# Patient Record
Sex: Male | Born: 1965 | Race: White | Hispanic: No | Marital: Married | Smoking: Never smoker
Health system: Southern US, Community
[De-identification: ages and names within clinical notes are randomized; demographics above are authoritative.]

## PROBLEM LIST (undated history)

## (undated) DIAGNOSIS — T148XXA Other injury of unspecified body region, initial encounter: Secondary | ICD-10-CM

## (undated) DIAGNOSIS — I10 Essential (primary) hypertension: Secondary | ICD-10-CM

## (undated) HISTORY — DX: Other injury of unspecified body region, initial encounter: T14.8XXA

## (undated) HISTORY — PX: ANAL FISSURE REPAIR: SHX2312

---

## 2008-08-11 ENCOUNTER — Ambulatory Visit: Payer: Self-pay | Admitting: Cardiovascular Disease

## 2008-08-11 ENCOUNTER — Ambulatory Visit: Payer: Self-pay | Admitting: Surgery

## 2008-08-15 ENCOUNTER — Ambulatory Visit: Payer: Self-pay | Admitting: Surgery

## 2009-03-04 ENCOUNTER — Ambulatory Visit: Payer: Self-pay | Admitting: Family Medicine

## 2011-04-07 ENCOUNTER — Ambulatory Visit: Payer: Self-pay | Admitting: Family Medicine

## 2011-04-08 ENCOUNTER — Ambulatory Visit: Payer: Self-pay | Admitting: Family Medicine

## 2012-02-15 LAB — HM COLONOSCOPY

## 2012-05-14 ENCOUNTER — Ambulatory Visit: Payer: Self-pay | Admitting: Family Medicine

## 2013-09-13 LAB — HEPATIC FUNCTION PANEL
ALK PHOS: 54 U/L (ref 25–125)
ALT: 92 U/L — AB (ref 10–40)
AST: 53 U/L — AB (ref 14–40)
Bilirubin, Total: 0.4 mg/dL

## 2013-09-13 LAB — LIPID PANEL
Cholesterol: 219 mg/dL — AB (ref 0–200)
HDL: 94 mg/dL — AB (ref 35–70)
LDL Cholesterol: 89 mg/dL
LDL/HDL RATIO: 0.9
Triglycerides: 181 mg/dL — AB (ref 40–160)

## 2013-09-13 LAB — BASIC METABOLIC PANEL
BUN: 12 mg/dL (ref 4–21)
Creatinine: 1.2 mg/dL (ref <=1.3)
GLUCOSE: 81 mg/dL
Potassium: 4.7 mmol/L (ref 3.4–5.3)
SODIUM: 144 mmol/L (ref 137–147)

## 2013-09-13 LAB — CBC AND DIFFERENTIAL
HEMATOCRIT: 48 % (ref 41–53)
Hemoglobin: 16.5 g/dL (ref 13.5–17.5)
Neutrophils Absolute: 55 /uL
Platelets: 227 10*3/uL (ref 150–399)
WBC: 4.4 10^3/mL

## 2013-09-13 LAB — PSA: PSA: 0.7

## 2013-09-13 LAB — TSH: TSH: 2.2 u[IU]/mL (ref <=5.90)

## 2014-09-05 DIAGNOSIS — E782 Mixed hyperlipidemia: Secondary | ICD-10-CM | POA: Insufficient documentation

## 2014-09-05 DIAGNOSIS — I493 Ventricular premature depolarization: Secondary | ICD-10-CM | POA: Insufficient documentation

## 2015-01-02 DIAGNOSIS — I872 Venous insufficiency (chronic) (peripheral): Secondary | ICD-10-CM | POA: Insufficient documentation

## 2015-01-02 DIAGNOSIS — I8390 Asymptomatic varicose veins of unspecified lower extremity: Secondary | ICD-10-CM | POA: Insufficient documentation

## 2015-01-02 DIAGNOSIS — K760 Fatty (change of) liver, not elsewhere classified: Secondary | ICD-10-CM | POA: Insufficient documentation

## 2015-01-02 DIAGNOSIS — E785 Hyperlipidemia, unspecified: Secondary | ICD-10-CM | POA: Insufficient documentation

## 2015-01-02 DIAGNOSIS — E669 Obesity, unspecified: Secondary | ICD-10-CM | POA: Insufficient documentation

## 2015-01-02 DIAGNOSIS — I10 Essential (primary) hypertension: Secondary | ICD-10-CM | POA: Insufficient documentation

## 2015-03-09 ENCOUNTER — Ambulatory Visit (INDEPENDENT_AMBULATORY_CARE_PROVIDER_SITE_OTHER): Payer: BC Managed Care – PPO | Admitting: Family Medicine

## 2015-03-09 ENCOUNTER — Encounter: Payer: Self-pay | Admitting: Family Medicine

## 2015-03-09 VITALS — BP 110/62 | HR 72 | Temp 97.9°F | Resp 16 | Wt 219.0 lb

## 2015-03-09 DIAGNOSIS — K219 Gastro-esophageal reflux disease without esophagitis: Secondary | ICD-10-CM | POA: Diagnosis not present

## 2015-03-09 DIAGNOSIS — R140 Abdominal distension (gaseous): Secondary | ICD-10-CM

## 2015-03-09 MED ORDER — OMEPRAZOLE 20 MG PO CPDR: 20.0000 mg | DELAYED_RELEASE_CAPSULE | Freq: Two times a day (BID) | ORAL | Status: DC

## 2015-03-09 NOTE — Progress Notes (Signed)
Patient ID: Mason Henderson, male   DOB: 1966-04-04, 49 y.o.   MRN: [id number]    Subjective:  HPI Pt is here today because he has been having some abdominal bloating for about a week and a half. He reports that it is mainly after he eats. He reports that since he made this appointment he has not had any, but think it could be a hernia. Denies increased belching, heartburn, nausea, vomiting, diarrhea or constipation. He reports that the bloating is like a pressure sensation in the epigastric area. Starts about 30 mins after he eats and last a couple hours. He reports that sometimes it does hurt through to the left side of is back.   Prior to Admission medications   Medication Sig Start Date End Date Taking? Authorizing Provider  amLODipine-olmesartan (AZOR) 10-40 MG per tablet Take by mouth. 05/20/14  Yes Historical Provider, MD  hydrochlorothiazide (HYDRODIURIL) 25 MG tablet Take by mouth. 08/18/14  Yes Historical Provider, MD  pravastatin (PRAVACHOL) 20 MG tablet Take by mouth. 07/16/14  Yes Historical Provider, MD    Patient Active Problem List   Diagnosis Date Noted  . Chronic venous insufficiency 01/02/2015  . Essential (primary) hypertension 01/02/2015  . Fatty infiltration of liver 01/02/2015  . HLD (hyperlipidemia) 01/02/2015  . Adiposity 01/02/2015  . 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 Beat, premature ventricular 09/05/2014    History reviewed. No pertinent past medical history.  History   Social History  . Marital Status: Single    Spouse Name: N/A  . Number of Children: N/A  . Years of Education: N/A   Occupational History  . Not on file.   Social History Main Topics  . Smoking status: Never Smoker   . Smokeless tobacco: Not on file  . Alcohol Use: Yes     Comment: one drink daily  . Drug Use: No  . Sexual Activity: Not on file   Other Topics Concern  . Not on file   Social History Narrative    No  Known Allergies  Review of Systems  Constitutional: Negative.   HENT: Negative.   Eyes: Negative.   Respiratory: Negative.   Cardiovascular: Negative.   Gastrointestinal: Negative.   Genitourinary: Negative.   Musculoskeletal: Negative.   Skin: Negative.   Neurological: Negative.   Endo/Heme/Allergies: Negative.   Psychiatric/Behavioral: Negative.     Immunization History  Administered Date(s) Administered  . Tdap 11/19/2008   Objective:  BP 110/62 mmHg  Pulse 72  Temp(Src) 97.9 F (36.6 C) (Oral)  Resp 16  Wt 219 lb (99.338 kg)  Physical Exam  Constitutional: He is oriented to person, place, and time and well-developed, well-nourished, and in no distress.  HENT:  Head: Normocephalic and atraumatic.  Right Ear: External ear normal.  Left Ear: External ear normal.  Nose: Nose normal.  Eyes: Conjunctivae and EOM are normal. Pupils are equal, round, and reactive to light.  Neck: Normal range of motion. Neck supple.  Cardiovascular: Normal rate, regular rhythm, normal heart sounds and intact distal pulses.   Pulmonary/Chest: Effort normal and breath sounds normal.  Abdominal: Soft. Bowel sounds are normal.  Negative Murphy's sign  Musculoskeletal: Normal range of motion.  Neurological: He is alert and oriented to person, place, and time. He has normal reflexes. GCS score is 15.  Skin: Skin is warm and dry.  Psychiatric: Mood, memory, affect and judgment normal.    Lab Results  Component Value Date   WBC  4.4 09/13/2013   HGB 16.5 09/13/2013   HCT 48 09/13/2013   PLT 227 09/13/2013   CHOL 219* 09/13/2013   TRIG 181* 09/13/2013   HDL 94* 09/13/2013   LDLCALC 89 09/13/2013   TSH 2.20 09/13/2013   PSA 0.7 09/13/2013    CMP     Component Value Date/Time   NA 144 09/13/2013   K 4.7 09/13/2013   BUN 12 09/13/2013   CREATININE 1.2 09/13/2013   AST 53* 09/13/2013   ALT 92* 09/13/2013   ALKPHOS 54 09/13/2013    Assessment and Plan :  1. Abdominal  bloating If not improved with the Omeprazole, will get gallbladder u/s. Pt will follow in a couple of weeks when he has his CPE. - omeprazole (PRILOSEC) 20 MG capsule; Take 1 capsule (20 mg total) by mouth 2 (two) times daily before a meal.  Dispense: 60 capsule; Refill: 12  2. Gastroesophageal reflux disease, esophagitis presence not specified  - omeprazole (PRILOSEC) 20 MG capsule; Take 1 capsule (20 mg total) by mouth 2 (two) times daily before a meal.  Dispense: 60 capsule; Refill: 12  I have done the exam and reviewed the above chart and it is accurate to the best of my knowledge.  Miguel Aschoff MD [facility] 03/09/2015 8:27 AM

## 2015-03-11 ENCOUNTER — Telehealth: Payer: Self-pay | Admitting: Family Medicine

## 2015-03-11 ENCOUNTER — Other Ambulatory Visit: Payer: Self-pay | Admitting: Family Medicine

## 2015-03-11 DIAGNOSIS — R1084 Generalized abdominal pain: Secondary | ICD-10-CM

## 2015-03-11 NOTE — Telephone Encounter (Signed)
His acid reflux is not really any better.  Please advise.  Thanks Con Memos

## 2015-03-11 NOTE — Telephone Encounter (Signed)
Patient called again saying that his symptoms are not any better. He says that it actually feels worse. Patient reports that he has severe pain after eating and the upper stomach pain radiates to his back. Patient would like to go ahead and proceed with gallbladder US since medication is not helping. Patient was seen in the office a few days ago by Dr. Rosanna Randy and was prescribed OTC omeprazole for symptoms. He was told that if meds do not help, then the next step would be to do a gallbladder US. Ok to proceed? Dr. Rosanna Randy is out of the office. Thanks!

## 2015-03-11 NOTE — Telephone Encounter (Signed)
Please schedule abdominal ultrasound. Thanks.

## 2015-03-17 ENCOUNTER — Ambulatory Visit
Admission: RE | Admit: 2015-03-17 | Discharge: 2015-03-17 | Disposition: A | Discharge status: Home / Self Care | Payer: BC Managed Care – PPO | Source: Ambulatory Visit | Attending: Family Medicine | Admitting: Family Medicine

## 2015-03-17 DIAGNOSIS — R1084 Generalized abdominal pain: Secondary | ICD-10-CM | POA: Diagnosis present

## 2015-03-17 DIAGNOSIS — K760 Fatty (change of) liver, not elsewhere classified: Secondary | ICD-10-CM | POA: Diagnosis not present

## 2015-03-25 ENCOUNTER — Encounter: Payer: Self-pay | Admitting: Family Medicine

## 2015-03-25 ENCOUNTER — Ambulatory Visit (INDEPENDENT_AMBULATORY_CARE_PROVIDER_SITE_OTHER): Payer: BC Managed Care – PPO | Admitting: Family Medicine

## 2015-03-25 VITALS — BP 110/62 | HR 66 | Temp 97.7°F | Resp 16 | Ht 70.0 in | Wt 222.0 lb

## 2015-03-25 DIAGNOSIS — Z1211 Encounter for screening for malignant neoplasm of colon: Secondary | ICD-10-CM | POA: Diagnosis not present

## 2015-03-25 DIAGNOSIS — Z125 Encounter for screening for malignant neoplasm of prostate: Secondary | ICD-10-CM | POA: Diagnosis not present

## 2015-03-25 DIAGNOSIS — Z0000 Encounter for general adult medical examination without abnormal findings: Secondary | ICD-10-CM

## 2015-03-25 LAB — HEMOCCULT GUIAC POC 1CARD (OFFICE): FECAL OCCULT BLD: NEGATIVE

## 2015-03-25 LAB — POCT URINALYSIS DIPSTICK
Bilirubin, UA: NEGATIVE
Blood, UA: NEGATIVE
GLUCOSE UA: NEGATIVE
Ketones, UA: NEGATIVE
LEUKOCYTES UA: NEGATIVE
Nitrite, UA: NEGATIVE
PROTEIN UA: NEGATIVE
Spec Grav, UA: 1.015
Urobilinogen, UA: 0.2
pH, UA: 6

## 2015-03-25 NOTE — Progress Notes (Signed)
Patient ID: Mason Henderson, male   DOB: 1965-09-27, 49 y.o.   MRN: [id number]       Patient: Mason Henderson, Male    DOB: 1965-09-09, 47 y.o.   MRN: [id number] Visit Date: 03/25/2015  Today's Provider: Wilhemena Durie, MD   Chief Complaint  Patient presents with  . Annual Exam   Subjective:    Annual physical exam Mason Henderson is a 49 y.o. male who presents today for health maintenance and complete physical. He feels well. He reports exercising about 3 times a week. He reports he is sleeping well.  ----------------------------------------------------------------- Colonoscopy 02/15/12 EKG- 03/03/11 Tdap 06/03/09 PSA 09/13/13   Review of Systems  Constitutional: Negative.   HENT: Negative.   Eyes: Negative.   Respiratory: Negative.   Cardiovascular: Negative.   Gastrointestinal: Positive for abdominal distention.       Abdominal symptoms are improving.  Endocrine: Negative.   Genitourinary: Negative.   Musculoskeletal: Negative.   Skin: Negative.   Allergic/Immunologic: Negative.   Neurological: Negative.   Hematological: Negative.   Psychiatric/Behavioral: Negative.   All other systems reviewed and are negative.   Social History He  reports that he has never smoked. He does not have any smokeless tobacco history on file. He reports that he drinks alcohol. He reports that he does not use illicit drugs.  Patient Active Problem List   Diagnosis Date Noted  . Chronic venous insufficiency 01/02/2015  . Essential (primary) hypertension 01/02/2015  . Fatty infiltration of liver 01/02/2015  . HLD (hyperlipidemia) 01/02/2015  . Adiposity 01/02/2015  . 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 Beat, premature ventricular 09/05/2014    Past Surgical History  Procedure Laterality Date  . Anal fissure repair      Family History  Family Status  Relation Status Death Age  . Mother Deceased 20  . Father Alive   .  Sister Alive   . Son Alive     His family history includes Breast cancer in his mother; CAD in his paternal grandfather; Colon polyps in his father; Diverticulitis in his father; Esophageal cancer in his mother; Heart attack in his paternal grandfather; Hypertension in his father and sister; Lung cancer in his father.    No Known Allergies  Previous Medications   AMLODIPINE-OLMESARTAN (AZOR) 10-40 MG PER TABLET    Take by mouth.   HYDROCHLOROTHIAZIDE (HYDRODIURIL) 25 MG TABLET    Take by mouth.   OMEPRAZOLE (PRILOSEC) 20 MG CAPSULE    Take 1 capsule (20 mg total) by mouth 2 (two) times daily before a meal.   PRAVASTATIN (PRAVACHOL) 20 MG TABLET    Take by mouth.    Patient Care Team: Jerrol Banana., MD as PCP - General (Family Medicine)     Objective:   Vitals: BP 110/62 mmHg  Pulse 66  Temp(Src) 97.7 F (36.5 C) (Oral)  Resp 16  Ht 5\' 10"  (1.778 m)  Wt 222 lb (100.699 kg)  BMI 31.85 kg/m2   Physical Exam  Constitutional: He is oriented to person, place, and time. He appears well-developed and well-nourished.  HENT:  Head: Normocephalic and atraumatic.  Right Ear: External ear normal.  Left Ear: External ear normal.  Nose: Nose normal.  Mouth/Throat: Oropharynx is clear and moist.  Eyes: Conjunctivae and EOM are normal. Pupils are equal, round, and reactive to light.  Neck: Normal range of motion. Neck supple.  Cardiovascular: Normal rate, regular rhythm, normal heart sounds and  intact distal pulses.   Pulmonary/Chest: Effort normal and breath sounds normal.  Abdominal: Soft. Bowel sounds are normal.  Genitourinary: Rectum normal, prostate normal and penis normal.  Musculoskeletal: Normal range of motion.  Neurological: He is alert and oriented to person, place, and time. He has normal reflexes.  Skin: Skin is warm and dry.  Psychiatric: He has a normal mood and affect. His behavior is normal. Judgment and thought content normal.     Depression Screen No  flowsheet data found.    Assessment & Plan:     Routine Health Maintenance and Physical Exam  Exercise Activities and Dietary recommendations Goals    None      Immunization History  Administered Date(s) Administered  . Tdap 11/19/2008    Health Maintenance  Topic Date Due  . HIV Screening  10/29/1980  . INFLUENZA VACCINE  03/09/2015  . TETANUS/TDAP  11/20/2018      Discussed health benefits of physical activity, and encouraged him to engage in regular exercise appropriate for his age and condition.   Hypertension Azor it is disallowed by insurance although he has been well controlled on this for at least 4-6 years. Switch to amlodipine and losartan 100. I think this will work. Return to clinic 1-2 months. 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 I have done the exam and reviewed the above chart and it is accurate to the best of my knowledge.  --------------------------------------------------------------------

## 2015-04-08 ENCOUNTER — Other Ambulatory Visit: Payer: Self-pay | Admitting: Family Medicine

## 2015-04-09 LAB — CBC WITH DIFFERENTIAL/PLATELET
BASOS ABS: 0.1 10*3/uL (ref 0.0–0.2)
Basos: 1 %
EOS (ABSOLUTE): 0.1 10*3/uL (ref 0.0–0.4)
Eos: 3 %
Hematocrit: 43.4 % (ref 37.5–51.0)
Hemoglobin: 15.3 g/dL (ref 12.6–17.7)
IMMATURE GRANS (ABS): 0 10*3/uL (ref 0.0–0.1)
Immature Granulocytes: 0 %
LYMPHS: 27 %
Lymphocytes Absolute: 1.3 10*3/uL (ref 0.7–3.1)
MCH: 32.1 pg (ref 26.6–33.0)
MCHC: 35.3 g/dL (ref 31.5–35.7)
MCV: 91 fL (ref 79–97)
MONOS ABS: 0.5 10*3/uL (ref 0.1–0.9)
Monocytes: 9 %
NEUTROS ABS: 2.9 10*3/uL (ref 1.4–7.0)
Neutrophils: 60 %
PLATELETS: 256 10*3/uL (ref 150–379)
RBC: 4.77 x10E6/uL (ref 4.14–5.80)
RDW: 12.9 % (ref 12.3–15.4)
WBC: 4.8 10*3/uL (ref 3.4–10.8)

## 2015-04-09 LAB — COMPREHENSIVE METABOLIC PANEL
A/G RATIO: 2.3 (ref 1.1–2.5)
ALK PHOS: 43 IU/L (ref 39–117)
ALT: 42 IU/L (ref 0–44)
AST: 32 IU/L (ref 0–40)
Albumin: 4.8 g/dL (ref 3.5–5.5)
BILIRUBIN TOTAL: 0.8 mg/dL (ref 0.0–1.2)
BUN/Creatinine Ratio: 19 (ref 9–20)
BUN: 22 mg/dL (ref 6–24)
CHLORIDE: 101 mmol/L (ref 97–108)
CO2: 24 mmol/L (ref 18–29)
Calcium: 10.5 mg/dL — ABNORMAL HIGH (ref 8.7–10.2)
Creatinine, Ser: 1.13 mg/dL (ref 0.76–1.27)
GFR calc Af Amer: 88 mL/min/{1.73_m2} (ref >59)
GFR calc non Af Amer: 76 mL/min/{1.73_m2} (ref >59)
GLUCOSE: 94 mg/dL (ref 65–99)
Globulin, Total: 2.1 g/dL (ref 1.5–4.5)
POTASSIUM: 4.6 mmol/L (ref 3.5–5.2)
Sodium: 139 mmol/L (ref 134–144)
TOTAL PROTEIN: 6.9 g/dL (ref 6.0–8.5)

## 2015-04-09 LAB — LIPID PANEL WITH LDL/HDL RATIO
CHOLESTEROL TOTAL: 171 mg/dL (ref 100–199)
HDL: 63 mg/dL (ref >39)
LDL Calculated: 83 mg/dL (ref 0–99)
LDl/HDL Ratio: 1.3 ratio units (ref 0.0–3.6)
TRIGLYCERIDES: 127 mg/dL (ref 0–149)
VLDL CHOLESTEROL CAL: 25 mg/dL (ref 5–40)

## 2015-04-09 LAB — PSA: Prostate Specific Ag, Serum: 0.6 ng/mL (ref 0.0–4.0)

## 2015-04-09 LAB — TSH: TSH: 1.57 u[IU]/mL (ref 0.450–4.500)

## 2015-05-20 ENCOUNTER — Other Ambulatory Visit: Payer: Self-pay | Admitting: Family Medicine

## 2015-06-04 ENCOUNTER — Other Ambulatory Visit: Payer: Self-pay | Admitting: Family Medicine

## 2015-08-11 ENCOUNTER — Other Ambulatory Visit: Payer: Self-pay | Admitting: Family Medicine

## 2015-08-20 ENCOUNTER — Other Ambulatory Visit: Payer: Self-pay | Admitting: Family Medicine

## 2015-09-23 ENCOUNTER — Ambulatory Visit: Payer: BC Managed Care – PPO | Admitting: Family Medicine

## 2015-09-30 ENCOUNTER — Ambulatory Visit (INDEPENDENT_AMBULATORY_CARE_PROVIDER_SITE_OTHER): Payer: BC Managed Care – PPO | Admitting: Family Medicine

## 2015-09-30 ENCOUNTER — Encounter: Payer: Self-pay | Admitting: Family Medicine

## 2015-09-30 VITALS — BP 138/78 | HR 82 | Temp 98.6°F | Resp 16 | Wt 222.0 lb

## 2015-09-30 DIAGNOSIS — E785 Hyperlipidemia, unspecified: Secondary | ICD-10-CM | POA: Diagnosis not present

## 2015-09-30 DIAGNOSIS — I10 Essential (primary) hypertension: Secondary | ICD-10-CM

## 2015-09-30 NOTE — Progress Notes (Signed)
Patient ID: Mason Henderson, male   DOB: 12-01-1965, 50 y.o.   MRN: [id number]    Subjective:  HPI  Hypertension, follow-up:  BP Readings from Last 3 Encounters:  09/30/15 138/78  03/25/15 110/62  03/09/15 110/62    He was last seen for hypertension 6 months ago.  BP at that visit was 110/62. Management since that visit includes none. He reports good compliance with treatment. He is not having side effects.  He is exercising. He is adherent to low salt diet.   Outside blood pressures are 120's/70-80's. He is experiencing none.  Patient denies chest pain, chest pressure/discomfort, claudication, exertional chest pressure/discomfort, fatigue, irregular heart beat, lower extremity edema, orthopnea and palpitations.   Wt Readings from Last 3 Encounters:  09/30/15 222 lb (100.699 kg)  03/25/15 222 lb (100.699 kg)  03/09/15 219 lb (99.338 kg)   ------------------------------------------------------------------------  On last labs elevated calcium was noted. Need to repeat calcium and PTH levels today. Pt reports that he has a water system on his house that he did not before and he calcium was never high until he moved in this house. He thinks this may be where this is coming from.    Prior to Admission medications   Medication Sig Start Date End Date Taking? Authorizing Provider  AZOR 10-40 MG tablet TAKE 1 TABLET BY MOUTH EVERY DAY 08/20/15  Yes Richard Maceo Pro., MD  hydrochlorothiazide (HYDRODIURIL) 25 MG tablet TAKE 1 TABLET BY MOUTH DAILY. 08/12/15  Yes Richard Maceo Pro., MD  omeprazole (PRILOSEC) 20 MG capsule Take 1 capsule (20 mg total) by mouth 2 (two) times daily before a meal. 03/09/15  Yes Jerrol Banana., MD  pravastatin (PRAVACHOL) 20 MG tablet TAKE 1 TABLET BY MOUTH EVERY DAY 06/04/15  Yes Jerrol Banana., MD    Patient Active Problem List   Diagnosis Date Noted  . Chronic venous insufficiency 01/02/2015  . Essential (primary) hypertension  01/02/2015  . Fatty infiltration of liver 01/02/2015  . HLD (hyperlipidemia) 01/02/2015  . Adiposity 01/02/2015  . 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 Beat, premature ventricular 09/05/2014    History reviewed. No pertinent past medical history.  Social History   Social History  . Marital Status: Single    Spouse Name: N/A  . Number of Children: N/A  . Years of Education: N/A   Occupational History  . Not on file.   Social History Main Topics  . Smoking status: Never Smoker   . Smokeless tobacco: Not on file  . Alcohol Use: Yes     Comment: one drink daily  . Drug Use: No  . Sexual Activity: Not on file   Other Topics Concern  . Not on file   Social History Narrative    No Known Allergies  Review of Systems  Constitutional: Negative.   HENT: Negative.   Eyes: Negative.   Respiratory: Negative.   Cardiovascular: Negative.   Gastrointestinal: Negative.   Genitourinary: Negative.   Musculoskeletal: Negative.   Skin: Negative.   Neurological: Negative.   Endo/Heme/Allergies: Negative.   Psychiatric/Behavioral: Negative.     Immunization History  Administered Date(s) Administered  . Influenza-Unspecified 06/09/2015  . Tdap 11/19/2008   Objective:  BP 138/78 mmHg  Pulse 82  Temp(Src) 98.6 F (37 C) (Oral)  Resp 16  Wt 222 lb (100.699 kg)  Physical Exam  Constitutional: He is oriented to person, place, and time and well-developed, well-nourished, and in  no distress.  Eyes: Conjunctivae and EOM are normal. Pupils are equal, round, and reactive to light.  Neck: Normal range of motion. Neck supple.  Cardiovascular: Normal rate, regular rhythm, normal heart sounds and intact distal pulses.   Pulmonary/Chest: Effort normal and breath sounds normal.  Abdominal: Soft. Bowel sounds are normal.  Musculoskeletal: Normal range of motion.  Neurological: He is alert and oriented to person, place, and time.  He has normal reflexes. Gait normal. GCS score is 15.  Skin: Skin is warm and dry.  Psychiatric: Mood, memory, affect and judgment normal.    Lab Results  Component Value Date   WBC 4.8 04/08/2015   HGB 16.5 09/13/2013   HCT 43.4 04/08/2015   PLT 256 04/08/2015   GLUCOSE 94 04/08/2015   CHOL 171 04/08/2015   TRIG 127 04/08/2015   HDL 63 04/08/2015   LDLCALC 83 04/08/2015   TSH 1.570 04/08/2015   PSA 0.7 09/13/2013    CMP     Component Value Date/Time   NA 139 04/08/2015 1011   K 4.6 04/08/2015 1011   CL 101 04/08/2015 1011   CO2 24 04/08/2015 1011   GLUCOSE 94 04/08/2015 1011   BUN 22 04/08/2015 1011   CREATININE 1.13 04/08/2015 1011   CREATININE 1.2 09/13/2013   CALCIUM 10.5* 04/08/2015 1011   PROT 6.9 04/08/2015 1011   ALBUMIN 4.8 04/08/2015 1011   AST 32 04/08/2015 1011   ALT 42 04/08/2015 1011   ALKPHOS 43 04/08/2015 1011   BILITOT 0.8 04/08/2015 1011   GFRNONAA 76 04/08/2015 1011   GFRAA 88 04/08/2015 1011    Assessment and Plan :  1. Essential (primary) hypertension Stable.   2. HLD (hyperlipidemia)   3. Hypercalcemia Could be due to water system. Will recheck and follow.  - Calcium, ionized - PTH, Intact and Calcium   Patient was seen and examined by Dr. Miguel Aschoff, and noted scribed by Webb Laws, Disney MD [facility] 09/30/2015 2:21 PM

## 2015-10-01 LAB — PTH, INTACT AND CALCIUM
CALCIUM: 10 mg/dL (ref 8.7–10.2)
PTH: 41 pg/mL (ref 15–65)

## 2015-10-01 LAB — CALCIUM, IONIZED: CALCIUM ION: 5.3 mg/dL (ref 4.5–5.6)

## 2015-11-11 ENCOUNTER — Other Ambulatory Visit: Payer: Self-pay | Admitting: Family Medicine

## 2016-04-14 ENCOUNTER — Ambulatory Visit (INDEPENDENT_AMBULATORY_CARE_PROVIDER_SITE_OTHER): Payer: BC Managed Care – PPO | Admitting: Family Medicine

## 2016-04-14 ENCOUNTER — Encounter: Payer: Self-pay | Admitting: Family Medicine

## 2016-04-14 VITALS — BP 108/60 | HR 68 | Temp 98.0°F | Resp 16 | Ht 70.0 in | Wt 222.0 lb

## 2016-04-14 DIAGNOSIS — Z1211 Encounter for screening for malignant neoplasm of colon: Secondary | ICD-10-CM | POA: Diagnosis not present

## 2016-04-14 DIAGNOSIS — Z0000 Encounter for general adult medical examination without abnormal findings: Secondary | ICD-10-CM

## 2016-04-14 DIAGNOSIS — Z125 Encounter for screening for malignant neoplasm of prostate: Secondary | ICD-10-CM | POA: Diagnosis not present

## 2016-04-14 LAB — POCT URINALYSIS DIPSTICK
BILIRUBIN UA: ABNORMAL
Blood, UA: NEGATIVE
GLUCOSE UA: NEGATIVE
Ketones, UA: NEGATIVE
Leukocytes, UA: NEGATIVE
NITRITE UA: NEGATIVE
Protein, UA: NEGATIVE
Spec Grav, UA: 1.02
UROBILINOGEN UA: NEGATIVE
pH, UA: 6

## 2016-04-14 LAB — IFOBT (OCCULT BLOOD): IMMUNOLOGICAL FECAL OCCULT BLOOD TEST: NEGATIVE

## 2016-04-14 NOTE — Progress Notes (Signed)
Patient: Mason Henderson, Male    DOB: [birth year], 50 y.o.   MRN: [id number] Visit Date: 04/14/2016  Today's Provider: Wilhemena Durie, MD   Chief Complaint  Patient presents with  . Annual Exam   Subjective:  Mason Henderson is a 50 y.o. male who presents today for health maintenance and complete physical. He feels well. He reports exercising 3 times weekly. He reports he is sleeping well. Immunization History  Administered Date(s) Administered  . Influenza-Unspecified 06/09/2015  . Tdap 11/19/2008   02/15/12 Colonoscopy-hemorrhoids, repeat 10 years, Dr. Vira Agar   Review of Systems  Constitutional: Negative.   HENT: Negative.   Eyes: Negative.   Respiratory: Negative.   Cardiovascular: Negative.   Gastrointestinal: Negative.   Endocrine: Negative.   Genitourinary: Negative.   Musculoskeletal: Negative.   Skin: Negative.   Allergic/Immunologic: Negative.   Neurological: Negative.   Hematological: Negative.   Psychiatric/Behavioral: Negative.     Social History   Social History  . Marital status: Single    Spouse name: N/A  . Number of children: N/A  . Years of education: N/A   Occupational History  . Not on file.   Social History Main Topics  . Smoking status: Never Smoker  . Smokeless tobacco: Not on file  . Alcohol use Yes     Comment: one drink daily  . Drug use: No  . Sexual activity: Not on file   Other Topics Concern  . Not on file   Social History Narrative  . No narrative on file    Patient Active Problem List   Diagnosis Date Noted  . Chronic venous insufficiency 01/02/2015  . Essential (primary) hypertension 01/02/2015  . Fatty infiltration of liver 01/02/2015  . HLD (hyperlipidemia) 01/02/2015  . Adiposity 01/02/2015  . 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 Beat, premature ventricular 09/05/2014    Past Surgical History:  Procedure Laterality Date  . ANAL FISSURE REPAIR       His family history includes Breast cancer in his mother; CAD in his paternal grandfather; Colon polyps in his father; Diverticulitis in his father; Esophageal cancer in his mother; Heart attack in his paternal grandfather; Hypertension in his father and sister; Lung cancer in his father.    Outpatient Encounter Prescriptions as of 04/14/2016  Medication Sig  . AZOR 10-40 MG tablet TAKE 1 TABLET BY MOUTH EVERY DAY  . etodolac (LODINE) 500 MG tablet Take 500 mg by mouth 2 (two) times daily as needed.  . hydrochlorothiazide (HYDRODIURIL) 25 MG tablet TAKE 1 TABLET BY MOUTH DAILY.  . pravastatin (PRAVACHOL) 20 MG tablet TAKE 1 TABLET BY MOUTH EVERY DAY  . [DISCONTINUED] omeprazole (PRILOSEC) 20 MG capsule Take 1 capsule (20 mg total) by mouth 2 (two) times daily before a meal.   No facility-administered encounter medications on file as of 04/14/2016.     Patient Care Team: Jerrol Banana., MD as PCP - General (Family Medicine)     Objective:   Vitals:  Vitals:   04/14/16 0850  BP: 108/60  Pulse: 68  Resp: 16  Temp: 98 F (36.7 C)  TempSrc: Oral  Weight: 222 lb (100.7 kg)  Height: 5\' 10"  (1.778 m)    Physical Exam  Constitutional: He is oriented to person, place, and time. He appears well-developed and well-nourished.  HENT:  Head: Normocephalic and atraumatic.  Right Ear: External ear normal.  Left Ear: External ear normal.  Nose: Nose normal.  Mouth/Throat:  Oropharynx is clear and moist.  Eyes: Conjunctivae and EOM are normal. Pupils are equal, round, and reactive to light.  Neck: Normal range of motion. Neck supple.  Cardiovascular: Normal rate, regular rhythm, normal heart sounds and intact distal pulses.   Pulmonary/Chest: Effort normal and breath sounds normal.  Abdominal: Soft. Bowel sounds are normal.  Small easily reducible umbilical hernia  Genitourinary: Rectum normal, prostate normal and penis normal.  Musculoskeletal: Normal range of motion.   Neurological: He is alert and oriented to person, place, and time.  Skin: Skin is warm and dry.  Psychiatric: He has a normal mood and affect. His behavior is normal. Judgment and thought content normal.     Depression Screen PHQ 2/9 Scores 04/14/2016  PHQ - 2 Score 0   Fall Risk  04/14/2016  Falls in the past year? Yes  Number falls in past yr: 1  Injury with Fall? Yes   Functional Status Survey: Is the patient deaf or have difficulty hearing?: No Does the patient have difficulty seeing, even when wearing glasses/contacts?: No Does the patient have difficulty concentrating, remembering, or making decisions?: No Does the patient have difficulty walking or climbing stairs?: No Does the patient have difficulty dressing or bathing?: No Does the patient have difficulty doing errands alone such as visiting a doctor's office or shopping?: No   Current Exercise Habits: Home exercise routine, Time (Minutes): 60, Frequency (Times/Week): 3, Weekly Exercise (Minutes/Week): 180 Exercise limited by: cardiac condition(s)    Assessment & Plan:     Routine Health Maintenance and Physical Exam  Exercise Activities and Dietary recommendations Goals    None      Immunization History  Administered Date(s) Administered  . Influenza-Unspecified 06/09/2015  . Tdap 11/19/2008    Health Maintenance  Topic Date Due  . HIV Screening  10/29/1980  . INFLUENZA VACCINE  03/08/2016  . TETANUS/TDAP  11/20/2018  . COLONOSCOPY  02/14/2022    1. Annual physical exam  - CBC with Differential/Platelet - Comprehensive metabolic panel - Lipid Panel With LDL/HDL Ratio - TSH - POCT urinalysis dipstick  2. Prostate cancer screening  - PSA  3. Colon cancer screening  - IFOBT POC (occult bld, rslt in office) 4. Small umbilical hernia  HPI, Exam and A&P Transcribed under the direction and in the presence of Miguel Aschoff, Brooke Bonito., MD. Electronically Signed: Althea Charon, RMA I have done the  exam and reviewed the above chart and it is accurate to the best of my knowledge.     Discussed health benefits of physical activity, and encouraged him to engage in regular exercise appropriate for his age and condition.

## 2016-04-26 LAB — COMPREHENSIVE METABOLIC PANEL
ALT: 29 IU/L (ref 0–44)
AST: 20 IU/L (ref 0–40)
Albumin/Globulin Ratio: 2.4 — ABNORMAL HIGH (ref 1.2–2.2)
Albumin: 4.7 g/dL (ref 3.5–5.5)
Alkaline Phosphatase: 44 IU/L (ref 39–117)
BUN/Creatinine Ratio: 15 (ref 9–20)
BUN: 21 mg/dL (ref 6–24)
Bilirubin Total: 0.5 mg/dL (ref 0.0–1.2)
CALCIUM: 10 mg/dL (ref 8.7–10.2)
CO2: 24 mmol/L (ref 18–29)
CREATININE: 1.36 mg/dL — AB (ref 0.76–1.27)
Chloride: 100 mmol/L (ref 96–106)
GFR calc Af Amer: 70 mL/min/{1.73_m2} (ref >59)
GFR, EST NON AFRICAN AMERICAN: 60 mL/min/{1.73_m2} (ref >59)
GLUCOSE: 101 mg/dL — AB (ref 65–99)
Globulin, Total: 2 g/dL (ref 1.5–4.5)
POTASSIUM: 4.6 mmol/L (ref 3.5–5.2)
Sodium: 141 mmol/L (ref 134–144)
TOTAL PROTEIN: 6.7 g/dL (ref 6.0–8.5)

## 2016-04-26 LAB — LIPID PANEL WITH LDL/HDL RATIO
Cholesterol, Total: 181 mg/dL (ref 100–199)
HDL: 53 mg/dL (ref >39)
LDL CALC: 95 mg/dL (ref 0–99)
LDL/HDL RATIO: 1.8 ratio (ref 0.0–3.6)
TRIGLYCERIDES: 167 mg/dL — AB (ref 0–149)
VLDL CHOLESTEROL CAL: 33 mg/dL (ref 5–40)

## 2016-04-26 LAB — CBC WITH DIFFERENTIAL/PLATELET
Basophils Absolute: 0.1 10*3/uL (ref 0.0–0.2)
Basos: 2 %
EOS (ABSOLUTE): 0.2 10*3/uL (ref 0.0–0.4)
Eos: 6 %
Hematocrit: 43.9 % (ref 37.5–51.0)
Hemoglobin: 15.3 g/dL (ref 12.6–17.7)
IMMATURE GRANS (ABS): 0 10*3/uL (ref 0.0–0.1)
IMMATURE GRANULOCYTES: 0 %
LYMPHS: 31 %
Lymphocytes Absolute: 1.2 10*3/uL (ref 0.7–3.1)
MCH: 31 pg (ref 26.6–33.0)
MCHC: 34.9 g/dL (ref 31.5–35.7)
MCV: 89 fL (ref 79–97)
MONOS ABS: 0.3 10*3/uL (ref 0.1–0.9)
Monocytes: 8 %
NEUTROS PCT: 53 %
Neutrophils Absolute: 2.1 10*3/uL (ref 1.4–7.0)
PLATELETS: 259 10*3/uL (ref 150–379)
RBC: 4.93 x10E6/uL (ref 4.14–5.80)
RDW: 12.7 % (ref 12.3–15.4)
WBC: 3.9 10*3/uL (ref 3.4–10.8)

## 2016-04-26 LAB — TSH: TSH: 1.76 u[IU]/mL (ref 0.450–4.500)

## 2016-04-26 LAB — PSA: Prostate Specific Ag, Serum: 0.7 ng/mL (ref 0.0–4.0)

## 2016-04-27 ENCOUNTER — Telehealth: Payer: Self-pay

## 2016-04-27 NOTE — Telephone Encounter (Signed)
Pt advised.   Thanks,   -Treyshon Buchanon  

## 2016-04-27 NOTE — Telephone Encounter (Signed)
-----   Message from Jerrol Banana., MD sent at 04/27/2016  9:23 AM EDT ----- Labs stable

## 2016-06-13 ENCOUNTER — Ambulatory Visit (INDEPENDENT_AMBULATORY_CARE_PROVIDER_SITE_OTHER): Payer: BC Managed Care – PPO | Admitting: Family Medicine

## 2016-06-13 VITALS — BP 104/62 | HR 76 | Temp 97.9°F | Resp 16 | Wt 226.0 lb

## 2016-06-13 DIAGNOSIS — K649 Unspecified hemorrhoids: Secondary | ICD-10-CM

## 2016-06-13 MED ORDER — HYDROCORTISONE ACETATE 25 MG RE SUPP: 25.0000 mg | Freq: Every evening | RECTAL | 1 refills | Status: AC | PRN

## 2016-06-13 NOTE — Progress Notes (Signed)
Mason Henderson  MRN: [id number] DOB: Dec 20, 1965  Subjective:  HPI  The patient is a 50 year old male who presents for evaluation of hemorrhoids.  He states he was backpacking 2 weeks ago and he was carrying heavy back pack during that time.  He came home and about 1 week ago noticed some hemorrhoids.  He states one is external and 1 is partially internal.  He states he has had hemorrhoids before but the internal one feels different.  He states it feels hard.  He has been using OTC medication on it but wanted to get it checked as it seemed different than those he has had in the past. Patient denies any blood, constipation, fever or abdominal pain.  Bowellmovements have been normal. He is worried about the possibility of cancer. Patient Active Problem List   Diagnosis Date Noted  . Chronic venous insufficiency 01/02/2015  . Essential (primary) hypertension 01/02/2015  . Fatty infiltration of liver 01/02/2015  . HLD (hyperlipidemia) 01/02/2015  . Adiposity 01/02/2015  . 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 Beat, premature ventricular 09/05/2014    Past Medical History:  Diagnosis Date  . Fracture    left ankle April 2017    Social History   Social History  . Marital status: Single    Spouse name: N/A  . Number of children: N/A  . Years of education: N/A   Occupational History  . Not on file.   Social History Main Topics  . Smoking status: Never Smoker  . Smokeless tobacco: Not on file  . Alcohol use Yes     Comment: one drink daily  . Drug use: No  . Sexual activity: Not on file   Other Topics Concern  . Not on file   Social History Narrative  . No narrative on file    Outpatient Encounter Prescriptions as of 06/13/2016  Medication Sig  . AZOR 10-40 MG tablet TAKE 1 TABLET BY MOUTH EVERY DAY  . etodolac (LODINE) 500 MG tablet Take 500 mg by mouth 2 (two) times daily as needed.  . hydrochlorothiazide  (HYDRODIURIL) 25 MG tablet TAKE 1 TABLET BY MOUTH DAILY.  . pravastatin (PRAVACHOL) 20 MG tablet TAKE 1 TABLET BY MOUTH EVERY DAY   No facility-administered encounter medications on file as of 06/13/2016.     No Known Allergies  Review of Systems  Constitutional: Negative for chills, fever and malaise/fatigue.  Respiratory: Negative for cough, shortness of breath and wheezing.   Cardiovascular: Negative for chest pain and palpitations.  Gastrointestinal: Negative for abdominal pain, blood in stool, constipation, diarrhea, heartburn, melena, nausea and vomiting.  Neurological: Negative for dizziness, weakness and headaches.  Endo/Heme/Allergies: Negative.   Psychiatric/Behavioral: Negative.     Objective:  BP 104/62 (BP Location: Right Arm, Patient Position: Sitting, Cuff Size: Normal)   Pulse 76   Temp 97.9 F (36.6 C) (Oral)   Resp 16   Wt 226 lb (102.5 kg)   BMI 32.43 kg/m   Physical Exam  Constitutional: He is oriented to person, place, and time and well-developed, well-nourished, and in no distress.  HENT:  Head: Normocephalic and atraumatic.  Eyes: No scleral icterus.  Cardiovascular: Normal rate and regular rhythm.   Pulmonary/Chest: Effort normal.  Genitourinary:  Genitourinary Comments: Perianal areas visualized and  small nonthrombosed internal hemorrhoid is noted.  Neurological: He is alert and oriented to person, place, and time. Gait normal. GCS score is 15.  Skin: Skin is  warm and dry.  Psychiatric: Mood, memory, affect and judgment normal.    Assessment and Plan :    1. Hemorrhoids, unspecified hemorrhoid type/Internal  Also use Sitz Baths. - hydrocortisone (ANUSOL-HC) 25 MG suppository; Place 1 suppository (25 mg total) rectally at bedtime as needed for hemorrhoids or itching.  Dispense: 12 suppository; Refill: 1  HPI, Exam and A&P Transcribed under the direction and in the presence of Wilhemena Durie., MD. Electronically Signed: Althea Charon,  RMA .rgxc

## 2016-08-03 ENCOUNTER — Other Ambulatory Visit: Payer: Self-pay | Admitting: Family Medicine

## 2016-08-03 NOTE — Telephone Encounter (Signed)
Last ov 06/13/16 last filled 06/04/15. Please review. Thank you. sd

## 2016-10-27 ENCOUNTER — Other Ambulatory Visit: Payer: Self-pay | Admitting: Family Medicine

## 2016-11-14 ENCOUNTER — Ambulatory Visit (INDEPENDENT_AMBULATORY_CARE_PROVIDER_SITE_OTHER): Payer: BC Managed Care – PPO | Admitting: Family Medicine

## 2016-11-14 ENCOUNTER — Encounter: Payer: Self-pay | Admitting: Family Medicine

## 2016-11-14 VITALS — BP 120/70 | HR 85 | Temp 98.2°F | Wt 223.4 lb

## 2016-11-14 DIAGNOSIS — H60503 Unspecified acute noninfective otitis externa, bilateral: Secondary | ICD-10-CM

## 2016-11-14 DIAGNOSIS — J069 Acute upper respiratory infection, unspecified: Secondary | ICD-10-CM

## 2016-11-14 MED ORDER — AMOXICILLIN 875 MG PO TABS: 875.0000 mg | ORAL_TABLET | Freq: Two times a day (BID) | ORAL | 0 refills | Status: DC

## 2016-11-14 NOTE — Patient Instructions (Signed)
Acute Bronchitis, Adult Acute bronchitis is sudden (acute) swelling of the air tubes (bronchi) in the lungs. Acute bronchitis causes these tubes to fill with mucus, which can make it hard to breathe. It can also cause coughing or wheezing. In adults, acute bronchitis usually goes away within 2 weeks. A cough caused by bronchitis may last up to 3 weeks. Smoking, allergies, and asthma can make the condition worse. Repeated episodes of bronchitis may cause further lung problems, such as chronic obstructive pulmonary disease (COPD). What are the causes? This condition can be caused by germs and by substances that irritate the lungs, including:  Cold and flu viruses. This condition is most often caused by the same virus that causes a cold.  Bacteria.  Exposure to tobacco smoke, dust, fumes, and air pollution. What increases the risk? This condition is more likely to develop in people who:  Have close contact with someone with acute bronchitis.  Are exposed to lung irritants, such as tobacco smoke, dust, fumes, and vapors.  Have a weak immune system.  Have a respiratory condition such as asthma. What are the signs or symptoms? Symptoms of this condition include:  A cough.  Coughing up clear, yellow, or green mucus.  Wheezing.  Chest congestion.  Shortness of breath.  A fever.  Body aches.  Chills.  A sore throat. How is this diagnosed? This condition is usually diagnosed with a physical exam. During the exam, your health care provider may order tests, such as chest X-rays, to rule out other conditions. He or she may also:  Test a sample of your mucus for bacterial infection.  Check the level of oxygen in your blood. This is done to check for pneumonia.  Do a chest X-ray or lung function testing to rule out pneumonia and other conditions.  Perform blood tests. Your health care provider will also ask about your symptoms and medical history. How is this treated? Most cases  of acute bronchitis clear up over time without treatment. Your health care provider may recommend:  Drinking more fluids. Drinking more makes your mucus thinner, which may make it easier to breathe.  Taking a medicine for a fever or cough.  Taking an antibiotic medicine.  Using an inhaler to help improve shortness of breath and to control a cough.  Using a cool mist vaporizer or humidifier to make it easier to breathe. Follow these instructions at home: Medicines  Take over-the-counter and prescription medicines only as told by your health care provider.  If you were prescribed an antibiotic, take it as told by your health care provider. Do not stop taking the antibiotic even if you start to feel better. General instructions  Get plenty of rest.  Drink enough fluids to keep your urine clear or pale yellow.  Avoid smoking and secondhand smoke. Exposure to cigarette smoke or irritating chemicals will make bronchitis worse. If you smoke and you need help quitting, ask your health care provider. Quitting smoking will help your lungs heal faster.  Use an inhaler, cool mist vaporizer, or humidifier as told by your health care provider.  Keep all follow-up visits as told by your health care provider. This is important. How is this prevented? To lower your risk of getting this condition again:  Wash your hands often with soap and water. If soap and water are not available, use hand sanitizer.  Avoid contact with people who have cold symptoms.  Try not to touch your hands to your mouth, nose, or eyes.    eyes.  Make sure to get the flu shot every year. Contact a health care provider if:  Your symptoms do not improve in 2 weeks of treatment. Get help right away if:  You cough up blood.  You have chest pain.  You have severe shortness of breath.  You become dehydrated.  You faint or keep feeling like you are going to faint.  You keep vomiting.  You have a severe headache.  Your  fever or chills gets worse. This information is not intended to replace advice given to you by your health care provider. Make sure you discuss any questions you have with your health care provider. Document Released: 09/01/2004 Document Revised: 02/17/2016 Document Reviewed: 01/13/2016 Elsevier Interactive Patient Education  2017 [place] media is inflammation of the middle ear. This condition occurs when an auditory tube (eustachian tube) is blocked in one or both ears. These tubes lead from the middle ear to the back of the nose (nasopharynx). This condition typically occurs when you experience changes in pressure, such as when flying or scuba diving. Untreated barotitis media may lead to damage or hearing loss (barotrauma), which may become permanent. What are the causes? This condition may be caused by changes in air pressure from:  Flying.  Scuba diving.  A nearby explosion. What increases the risk? The following factors may make you more likely to develop this condition:  Middle ear infection.  Sinus infection.  A cold.  Environmental allergies.  Small eustachian tubes.  Recent ear surgery. What are the signs or symptoms? Symptoms of this condition may include:  Ear pain.  Hearing loss. In severe cases, symptoms can include:  Dizziness and nausea (vertigo).  Temporary facial paralysis. How is this diagnosed? This condition is diagnosed based on:  A physical exam. Your health care provider may:  Use a device (otoscope) to look into your ear canal and check your eardrum.  Do a test that changes air pressure in the middle ear to check how well the eardrum moves and to see if the eustachian tube is working(tympanogram).  Your medical history. In some cases, your health care provider may have you take a hearing test. You may also be referred to someone who specializes in ear treatment (otolaryngologist, "ENT"). How is this  treated? This condition may be treated with:  Medicines to relieve congestion in your nose, sinus, or upper respiratory tract (decongestants).  Techniques to equalize pressure (to "pop" your ears), such as:  Yawning.  Chewing gum.  Swallowing. In severe cases, you may need surgery to relieve your symptoms or to prevent future inflammation. Follow these instructions at home:  Take over-the-counter and prescription medicines only as told by your health care provider.  Do not put anything into your ears to clean or unplug them. Ear drops will not help.  Keep all follow-up visits as told by your health care provider. This is important. How is this prevented? Using these strategies may help to prevent barotitis media:  Chewing gum with frequent, forceful swallowing during takeoff and landing when flying.  Holding your nose and gently blowing to pop your ears for equalizing pressure changes. This forces air into the eustachian tube.  Yawning during air pressure changes.  Using a nasal decongestant about 30-60 minutes before flying, if you have nasal congestion. Contact a health care provider if:  You have vertigo.  You have hearing loss.  Your symptoms do not get better or they get worse.  You have a  fever. Get help right away if:  You have a severe headache, ear pain, and dizziness.  You have balance problems.  You cannot move or feel part of your face.  You have bloody or pus-like drainage from your ears. Summary  Barotitis media is inflammation of the middle ear.  This condition typically occurs when you experience changes in pressure, such as when flying or scuba diving.  You may be at a higher risk for this condition if you have small eustachian tubes, had recent ear surgery, or have allergies, a cold, or sinus or middle ear infection.  This condition may be treated with medicines or techniques to equalize pressure in your ears.  Strategies can be used to help  prevent barotitis media. This information is not intended to replace advice given to you by your health care provider. Make sure you discuss any questions you have with your health care provider. Document Released: 07/22/2000 Document Revised: 06/13/2016 Document Reviewed: 06/13/2016 Elsevier Interactive Patient Education  2017 [employer].

## 2016-11-14 NOTE — Progress Notes (Signed)
Patient: Mason Henderson Male    DOB: 03/02/1966   51 y.o.   MRN: [id number] Visit Date: 11/14/2016  Today's Provider: Vernie Murders, PA   Chief Complaint  Patient presents with  . URI   Subjective:    URI   This is a new problem. Episode onset: 6-7 days ago after returning from a cruise. Maximum temperature: 99.9 last night. Associated symptoms include congestion, coughing, headaches, sinus pain and a sore throat (improved ). Associated symptoms comments: Green phlegm . Treatments tried: OTC cold and flu medication. The treatment provided mild relief.   Patient Active Problem List   Diagnosis Date Noted  . Chronic venous insufficiency 01/02/2015  . Essential (primary) hypertension 01/02/2015  . Fatty infiltration of liver 01/02/2015  . HLD (hyperlipidemia) 01/02/2015  . Adiposity 01/02/2015  . 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 Beat, premature ventricular 09/05/2014   Past Surgical History:  Procedure Laterality Date  . ANAL FISSURE REPAIR     Family History  Problem Relation Age of Onset  . Breast cancer Mother   . Esophageal cancer Mother   . Lung cancer Father   . Hypertension Father   . Diverticulitis Father   . Colon polyps Father   . Hypertension Sister   . Heart attack Paternal Grandfather   . CAD Paternal Grandfather    No Known Allergies   Previous Medications   AZOR 10-40 MG TABLET    TAKE 1 TABLET BY MOUTH EVERY DAY   ETODOLAC (LODINE) 500 MG TABLET    Take 500 mg by mouth 2 (two) times daily as needed.   HYDROCHLOROTHIAZIDE (HYDRODIURIL) 25 MG TABLET    TAKE 1 TABLET BY MOUTH DAILY.   HYDROCORTISONE (ANUSOL-HC) 25 MG SUPPOSITORY    Place 1 suppository (25 mg total) rectally at bedtime as needed for hemorrhoids or itching.   PRAVASTATIN (PRAVACHOL) 20 MG TABLET    TAKE 1 TABLET BY MOUTH EVERY DAY    Review of Systems  Constitutional: Positive for fever.  HENT: Positive for congestion, sinus  pain and sore throat (improved ).   Respiratory: Positive for cough.   Cardiovascular: Negative.   Neurological: Positive for headaches.    Social History  Substance Use Topics  . Smoking status: Never Smoker  . Smokeless tobacco: Not on file  . Alcohol use Yes     Comment: one drink daily   Objective:   BP 120/70 (BP Location: Right Arm, Patient Position: Sitting, Cuff Size: Normal)   Pulse 85   Temp 98.2 F (36.8 C) (Oral)   Wt 223 lb 6.4 oz (101.3 kg)   SpO2 96%   BMI 32.05 kg/m   Physical Exam  Constitutional: He is oriented to person, place, and time. He appears well-developed and well-nourished. No distress.  HENT:  Head: Normocephalic and atraumatic.  Right Ear: Hearing normal.  Left Ear: Hearing normal.  Nose: Nose normal.  Mouth/Throat: Oropharynx is clear and moist.  Hazy with slight redness of TM's bilaterally. Good sinus transillumination without tenderness.  Eyes: Conjunctivae and lids are normal. Right eye exhibits no discharge. Left eye exhibits no discharge. No scleral icterus.  Cardiovascular: Normal rate and regular rhythm.   Pulmonary/Chest: Effort normal. No respiratory distress.  Coarse breath sounds without wheeze or rales.  Abdominal: Soft.  Musculoskeletal: Normal range of motion.  Lymphadenopathy:    He has no cervical adenopathy.  Neurological: He is alert and oriented to person, place, and time.  Skin:  Skin is intact. No lesion and no rash noted.  Psychiatric: He has a normal mood and affect. His speech is normal and behavior is normal. Thought content normal.      Assessment & Plan:     1. URI with cough and congestion Onset with sore throat then cough and congestion over the past week. Some relief from cough and cold medications OTC. Some fever last night with ear ache on the flight back from his cruise. May add Advil and Nasonex spray. Increase fluid intake and recheck prn.  2. Acute otitis externa of both ears, unspecified type Onset  flying back from his cruise last week. Suspect some barotrauma to the ears. Treat with antibiotic for bronchitis and otitis. Proceed with above medications and recheck prn. - amoxicillin (AMOXIL) 875 MG tablet; Take 1 tablet (875 mg total) by mouth 2 (two) times daily.  Dispense: 20 tablet; Refill: 0

## 2016-11-20 ENCOUNTER — Other Ambulatory Visit: Payer: Self-pay | Admitting: Family Medicine

## 2017-04-18 ENCOUNTER — Encounter: Payer: BC Managed Care – PPO | Admitting: Family Medicine

## 2017-05-09 ENCOUNTER — Encounter: Payer: BC Managed Care – PPO | Admitting: Family Medicine

## 2017-07-11 ENCOUNTER — Ambulatory Visit (INDEPENDENT_AMBULATORY_CARE_PROVIDER_SITE_OTHER): Payer: BC Managed Care – PPO | Admitting: Family Medicine

## 2017-07-11 ENCOUNTER — Other Ambulatory Visit: Payer: Self-pay

## 2017-07-11 VITALS — BP 114/62 | HR 72 | Temp 98.0°F | Resp 16 | Ht 70.0 in | Wt 224.0 lb

## 2017-07-11 DIAGNOSIS — D229 Melanocytic nevi, unspecified: Secondary | ICD-10-CM | POA: Diagnosis not present

## 2017-07-11 DIAGNOSIS — Z0000 Encounter for general adult medical examination without abnormal findings: Secondary | ICD-10-CM | POA: Diagnosis not present

## 2017-07-11 DIAGNOSIS — B354 Tinea corporis: Secondary | ICD-10-CM

## 2017-07-11 DIAGNOSIS — Z125 Encounter for screening for malignant neoplasm of prostate: Secondary | ICD-10-CM | POA: Diagnosis not present

## 2017-07-11 MED ORDER — KETOCONAZOLE 2 % EX CREA: 1.0000 "application " | TOPICAL_CREAM | Freq: Every day | CUTANEOUS | 0 refills | Status: AC

## 2017-07-11 NOTE — Progress Notes (Signed)
Patient: Mason Henderson, Male    DOB: [birth year], 51 y.o.   MRN: [id number] Visit Date: 07/11/2017  Today's Provider: Wilhemena Durie, MD   Chief Complaint  Patient presents with  . Annual Exam   Subjective:  Mason Henderson is a 51 y.o. male who presents today for health maintenance and complete physical. He feels well. He reports exercising 2 per week. He reports he is sleeping well.  Immunization History  Administered Date(s) Administered  . Influenza,inj,Quad PF,6+ Mos 05/29/2014  . Influenza-Unspecified 06/09/2015, 05/21/2017  . Tdap 11/19/2008, 06/03/2009   02/15/2012 Colonoscopy-hemorrhoids, repeat 10 years.  Review of Systems  Constitutional: Negative.   HENT: Negative.   Eyes: Positive for photophobia.  Respiratory: Negative.   Cardiovascular: Negative.   Gastrointestinal: Negative.   Endocrine: Negative.   Genitourinary: Negative.   Musculoskeletal: Negative.   Skin: Negative.   Allergic/Immunologic: Negative.   Neurological: Negative.   Hematological: Negative.   Psychiatric/Behavioral: Negative.     Social History   Socioeconomic History  . Marital status: Single    Spouse name: Not on file  . Number of children: Not on file  . Years of education: Not on file  . Highest education level: Not on file  Social Needs  . Financial resource strain: Not on file  . Food insecurity - worry: Not on file  . Food insecurity - inability: Not on file  . Transportation needs - medical: Not on file  . Transportation needs - non-medical: Not on file  Occupational History  . Not on file  Tobacco Use  . Smoking status: Never Smoker  . Smokeless tobacco: Never Used  Substance and Sexual Activity  . Alcohol use: Yes    Comment: one drink daily  . Drug use: No  . Sexual activity: Not on file  Other Topics Concern  . Not on file  Social History Narrative  . Not on file    Patient Active Problem List   Diagnosis Date Noted  . Chronic venous insufficiency  01/02/2015  . Essential (primary) hypertension 01/02/2015  . Fatty infiltration of liver 01/02/2015  . HLD (hyperlipidemia) 01/02/2015  . Adiposity 01/02/2015  . 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 Beat, premature ventricular 09/05/2014    Past Surgical History:  Procedure Laterality Date  . ANAL FISSURE REPAIR      His family history includes Breast cancer in his mother; CAD in his paternal grandfather; Colon polyps in his father; Diverticulitis in his father; Esophageal cancer in his mother; Heart attack in his paternal grandfather; Hypertension in his father and sister; Lung cancer in his father.     Outpatient Encounter Medications as of 07/11/2017  Medication Sig  . amLODipine-olmesartan (AZOR) 10-40 MG tablet TAKE 1 TABLET BY MOUTH EVERY DAY  . hydrochlorothiazide (HYDRODIURIL) 25 MG tablet TAKE 1 TABLET BY MOUTH DAILY.  . hydrocortisone (ANUSOL-HC) 25 MG suppository Place 1 suppository (25 mg total) rectally at bedtime as needed for hemorrhoids or itching.  . pravastatin (PRAVACHOL) 20 MG tablet TAKE 1 TABLET BY MOUTH EVERY DAY  . [DISCONTINUED] amoxicillin (AMOXIL) 875 MG tablet Take 1 tablet (875 mg total) by mouth 2 (two) times daily.  . [DISCONTINUED] etodolac (LODINE) 500 MG tablet Take 500 mg by mouth 2 (two) times daily as needed.   No facility-administered encounter medications on file as of 07/11/2017.     Patient Care Team: Jerrol Banana., MD as PCP - General (Family Medicine)  Objective:   Vitals:  Vitals:   07/11/17 1411  BP: 114/62  Pulse: 72  Resp: 16  Temp: 98 F (36.7 C)  TempSrc: Oral  Weight: 224 lb (101.6 kg)  Height: 5\' 10"  (1.778 m)    Physical Exam  Constitutional: He is oriented to person, place, and time. He appears well-developed and well-nourished.  HENT:  Head: Normocephalic and atraumatic.  Right Ear: External ear normal.  Left Ear: External ear normal.  Nose:  Nose normal.  Mouth/Throat: Oropharynx is clear and moist.  Eyes: Conjunctivae and EOM are normal. Pupils are equal, round, and reactive to light.  Neck: Normal range of motion. Neck supple.  Cardiovascular: Normal rate, regular rhythm, normal heart sounds and intact distal pulses.  Pulmonary/Chest: Effort normal and breath sounds normal.  Abdominal: Soft. Bowel sounds are normal.  Genitourinary: Rectum normal, prostate normal and penis normal.  Musculoskeletal: Normal range of motion.  Neurological: He is alert and oriented to person, place, and time.  Skin: Skin is warm and dry. Rash noted.  Atypical irregular nevus on mid chest. Rash in right upper leg c/w Tinea.  Psychiatric: He has a normal mood and affect. His behavior is normal. Judgment and thought content normal.   Fall Risk  07/11/2017 04/14/2016  Falls in the past year? No Yes  Comment - Patient was stepping off of the lawn mower and fractured his ankle  Number falls in past yr: - 1  Injury with Fall? - Yes    Functional Status Survey: Is the patient deaf or have difficulty hearing?: No Does the patient have difficulty seeing, even when wearing glasses/contacts?: No Does the patient have difficulty concentrating, remembering, or making decisions?: No Does the patient have difficulty walking or climbing stairs?: No Does the patient have difficulty dressing or bathing?: No Does the patient have difficulty doing errands alone such as visiting a doctor's office or shopping?: No   Depression Screen PHQ 2/9 Scores 07/11/2017 04/14/2016  PHQ - 2 Score 0 0  PHQ- 9 Score 0 -      Assessment & Plan:     Routine Health Maintenance and Physical Exam  Exercise Activities and Dietary recommendations Goals    None      Immunization History  Administered Date(s) Administered  . Influenza,inj,Quad PF,6+ Mos 05/29/2014  . Influenza-Unspecified 06/09/2015, 05/21/2017  . Tdap 11/19/2008, 06/03/2009    Health Maintenance   Topic Date Due  . HIV Screening  10/29/1980  . INFLUENZA VACCINE  03/08/2017  . TETANUS/TDAP  06/04/2019  . COLONOSCOPY  02/14/2022     Discussed health benefits of physical activity, and encouraged him to engage in regular exercise appropriate for his age and condition.  Atypical Nevus Refer to derm.   I have done the exam and reviewed the chart and it is accurate to the best of my knowledge. Development worker, community has been used and  any errors in dictation or transcription are unintentional. Miguel Aschoff M.D. [facility]

## 2017-07-20 ENCOUNTER — Encounter: Payer: Self-pay | Admitting: Family Medicine

## 2017-11-01 ENCOUNTER — Other Ambulatory Visit: Payer: Self-pay | Admitting: Family Medicine

## 2017-11-19 ENCOUNTER — Other Ambulatory Visit: Payer: Self-pay | Admitting: Family Medicine

## 2017-12-20 ENCOUNTER — Other Ambulatory Visit: Payer: Self-pay | Admitting: Physician Assistant

## 2017-12-27 ENCOUNTER — Ambulatory Visit: Payer: BC Managed Care – PPO | Admitting: Family Medicine

## 2017-12-27 ENCOUNTER — Encounter: Payer: Self-pay | Admitting: Family Medicine

## 2017-12-27 VITALS — BP 124/70 | HR 74 | Temp 98.7°F | Resp 14 | Wt 223.0 lb

## 2017-12-27 DIAGNOSIS — E782 Mixed hyperlipidemia: Secondary | ICD-10-CM | POA: Diagnosis not present

## 2017-12-27 DIAGNOSIS — I10 Essential (primary) hypertension: Secondary | ICD-10-CM

## 2017-12-27 DIAGNOSIS — Z125 Encounter for screening for malignant neoplasm of prostate: Secondary | ICD-10-CM | POA: Diagnosis not present

## 2017-12-27 NOTE — Progress Notes (Signed)
Patient: Mason Henderson Male    DOB: 10/02/1965   52 y.o.   MRN: [id number] Visit Date: 12/27/2017  Today's Provider: Wilhemena Durie, MD   Chief Complaint  Patient presents with  . Hypertension  . Hyperlipidemia   Subjective:     Pt reports that he was told he needed to be seen again to get another lab slip. He also needs a refill on his Pravastatin.   Hypertension, follow-up:  BP Readings from Last 3 Encounters:  12/27/17 124/70  07/11/17 114/62  11/14/16 120/70    He was last seen for hypertension 6 months ago.  BP at that visit was 114/62. Management since that visit includes none. He reports good compliance with treatment. He is not having side effects.  He is exercising. He is adherent to low salt diet.   Outside blood pressures are 120/70's. Patient denies chest pain, chest pressure/discomfort, claudication, dyspnea, exertional chest pressure/discomfort, fatigue, irregular heart beat, lower extremity edema, near-syncope, orthopnea, palpitations, paroxysmal nocturnal dyspnea, syncope and tachypnea.    Wt Readings from Last 3 Encounters:  12/27/17 223 lb (101.2 kg)  07/11/17 224 lb (101.6 kg)  11/14/16 223 lb 6.4 oz (101.3 kg)   ------------------------------------------------------------------------     No Known Allergies   Current Outpatient Medications:  .  amLODipine-olmesartan (AZOR) 10-40 MG tablet, TAKE 1 TABLET BY MOUTH EVERY DAY, Disp: 90 tablet, Rfl: 0 .  hydrochlorothiazide (HYDRODIURIL) 25 MG tablet, TAKE 1 TABLET BY MOUTH DAILY, Disp: 90 tablet, Rfl: 3 .  pravastatin (PRAVACHOL) 20 MG tablet, TAKE 1 TABLET BY MOUTH EVERY DAY, Disp: 90 tablet, Rfl: 3 .  hydrocortisone (ANUSOL-HC) 25 MG suppository, Place 1 suppository (25 mg total) rectally at bedtime as needed for hemorrhoids or itching. (Patient not taking: Reported on 12/27/2017), Disp: 12 suppository, Rfl: 1 .  ketoconazole (NIZORAL) 2 % cream, Apply 1 application topically daily.  (Patient not taking: Reported on 12/27/2017), Disp: 15 g, Rfl: 0  Review of Systems  Constitutional: Negative.   HENT: Negative.   Eyes: Negative.   Respiratory: Negative.   Cardiovascular: Negative.   Gastrointestinal: Negative.   Endocrine: Negative.   Genitourinary: Negative.   Musculoskeletal: Negative.   Skin: Negative.   Allergic/Immunologic: Negative.   Neurological: Negative.   Hematological: Negative.   Psychiatric/Behavioral: Negative.     Social History   Tobacco Use  . Smoking status: Never Smoker  . Smokeless tobacco: Never Used  Substance Use Topics  . Alcohol use: Yes    Comment: one drink daily   Objective:   BP 124/70 (BP Location: Left Arm, Patient Position: Sitting, Cuff Size: Normal)   Pulse 74   Temp 98.7 F (37.1 C) (Oral)   Resp 14   Wt 223 lb (101.2 kg)   BMI 32.00 kg/m  Vitals:   12/27/17 1400  BP: 124/70  Pulse: 74  Resp: 14  Temp: 98.7 F (37.1 C)  TempSrc: Oral  Weight: 223 lb (101.2 kg)     Physical Exam  Constitutional: He is oriented to person, place, and time. He appears well-developed and well-nourished.  HENT:  Head: Normocephalic and atraumatic.  Eyes: Conjunctivae are normal. No scleral icterus.  Neck: No thyromegaly present.  Cardiovascular: Normal rate, regular rhythm and normal heart sounds.  Pulmonary/Chest: Effort normal and breath sounds normal.  Abdominal: Soft.  Musculoskeletal: He exhibits no edema.  Neurological: He is alert and oriented to person, place, and time.  Skin: Skin is warm and dry.  Psychiatric:  He has a normal mood and affect. His behavior is normal. Judgment and thought content normal.        Assessment & Plan:     1. Essential (primary) hypertension  - CBC with Differential/Platelet - TSH  2. Mixed hyperlipidemia  - Lipid panel - Comprehensive metabolic panel  3. Prostate cancer screening  - PSA  I have done the exam and reviewed the chart and it is accurate to the best of my  knowledge. Development worker, community has been used and  any errors in dictation or transcription are unintentional. Miguel Aschoff M.D. [place], MD  [facility]

## 2018-01-04 ENCOUNTER — Telehealth: Payer: Self-pay

## 2018-01-04 LAB — TSH: TSH: 2.12 u[IU]/mL (ref 0.450–4.500)

## 2018-01-04 LAB — COMPREHENSIVE METABOLIC PANEL
ALK PHOS: 45 IU/L (ref 39–117)
ALT: 41 IU/L (ref 0–44)
AST: 35 IU/L (ref 0–40)
Albumin/Globulin Ratio: 1.9 (ref 1.2–2.2)
Albumin: 4.6 g/dL (ref 3.5–5.5)
BILIRUBIN TOTAL: 0.5 mg/dL (ref 0.0–1.2)
BUN/Creatinine Ratio: 12 (ref 9–20)
BUN: 15 mg/dL (ref 6–24)
CHLORIDE: 101 mmol/L (ref 96–106)
CO2: 23 mmol/L (ref 20–29)
CREATININE: 1.26 mg/dL (ref 0.76–1.27)
Calcium: 10.2 mg/dL (ref 8.7–10.2)
GFR calc Af Amer: 75 mL/min/{1.73_m2} (ref >59)
GFR calc non Af Amer: 65 mL/min/{1.73_m2} (ref >59)
GLUCOSE: 85 mg/dL (ref 65–99)
Globulin, Total: 2.4 g/dL (ref 1.5–4.5)
Potassium: 4.7 mmol/L (ref 3.5–5.2)
Sodium: 141 mmol/L (ref 134–144)
Total Protein: 7 g/dL (ref 6.0–8.5)

## 2018-01-04 LAB — CBC WITH DIFFERENTIAL/PLATELET
BASOS: 1 %
Basophils Absolute: 0 10*3/uL (ref 0.0–0.2)
EOS (ABSOLUTE): 0.1 10*3/uL (ref 0.0–0.4)
EOS: 3 %
Hematocrit: 42.3 % (ref 37.5–51.0)
Hemoglobin: 14.8 g/dL (ref 13.0–17.7)
IMMATURE GRANULOCYTES: 0 %
Immature Grans (Abs): 0 10*3/uL (ref 0.0–0.1)
Lymphocytes Absolute: 1.3 10*3/uL (ref 0.7–3.1)
Lymphs: 34 %
MCH: 31.6 pg (ref 26.6–33.0)
MCHC: 35 g/dL (ref 31.5–35.7)
MCV: 90 fL (ref 79–97)
MONOS ABS: 0.4 10*3/uL (ref 0.1–0.9)
Monocytes: 10 %
NEUTROS PCT: 52 %
Neutrophils Absolute: 2 10*3/uL (ref 1.4–7.0)
Platelets: 243 10*3/uL (ref 150–450)
RBC: 4.68 x10E6/uL (ref 4.14–5.80)
RDW: 13.7 % (ref 12.3–15.4)
WBC: 3.8 10*3/uL (ref 3.4–10.8)

## 2018-01-04 LAB — LIPID PANEL
CHOL/HDL RATIO: 3.5 ratio (ref 0.0–5.0)
Cholesterol, Total: 241 mg/dL — ABNORMAL HIGH (ref 100–199)
HDL: 69 mg/dL (ref >39)
LDL Calculated: 113 mg/dL — ABNORMAL HIGH (ref 0–99)
Triglycerides: 295 mg/dL — ABNORMAL HIGH (ref 0–149)
VLDL Cholesterol Cal: 59 mg/dL — ABNORMAL HIGH (ref 5–40)

## 2018-01-04 LAB — PSA: PROSTATE SPECIFIC AG, SERUM: 0.7 ng/mL (ref 0.0–4.0)

## 2018-01-04 MED ORDER — PRAVASTATIN SODIUM 20 MG PO TABS: 20.0000 mg | ORAL_TABLET | Freq: Every day | ORAL | 3 refills | Status: DC

## 2018-01-04 NOTE — Telephone Encounter (Signed)
-----   Message from Jerrol Banana., MD sent at 01/04/2018 10:46 AM EDT ----- labs ok

## 2018-01-04 NOTE — Telephone Encounter (Signed)
Advised patient of results. Patient is requesting that pravastatin be refilled. Ok to send into the pharmacy? If so, how many refills? Thanks!

## 2018-01-10 ENCOUNTER — Ambulatory Visit: Payer: Self-pay | Admitting: Family Medicine

## 2018-02-05 ENCOUNTER — Other Ambulatory Visit: Payer: Self-pay | Admitting: Family Medicine

## 2018-07-12 ENCOUNTER — Encounter: Payer: Self-pay | Admitting: Family Medicine

## 2018-07-16 ENCOUNTER — Ambulatory Visit (INDEPENDENT_AMBULATORY_CARE_PROVIDER_SITE_OTHER): Payer: BC Managed Care – PPO | Admitting: Family Medicine

## 2018-07-16 VITALS — BP 130/72 | HR 72 | Temp 98.2°F | Resp 16 | Ht 70.0 in | Wt 226.0 lb

## 2018-07-16 DIAGNOSIS — Z0000 Encounter for general adult medical examination without abnormal findings: Secondary | ICD-10-CM

## 2018-07-16 DIAGNOSIS — K429 Umbilical hernia without obstruction or gangrene: Secondary | ICD-10-CM | POA: Diagnosis not present

## 2018-07-16 NOTE — Progress Notes (Signed)
Patient: Mason Henderson, Male    DOB: [birth year], 52 y.o.   MRN: [id number] Visit Date: 07/16/2018  Today's Provider: Wilhemena Durie, MD   Chief Complaint  Patient presents with  . Annual Exam   Subjective:  Mason Henderson is a 52 y.o. male who presents today for health maintenance and complete physical. He feels well. He reports exercising some. He reports he is sleeping well. He is married and works at [employer].  This is his second marriage.  He has 1 son age 47, 75 stepson age 27.  Things are going well.  Immunization History  Administered Date(s) Administered  . Influenza,inj,Quad PF,6+ Mos 05/29/2014  . Influenza-Unspecified 06/09/2015, 05/21/2017, 04/08/2018  . Tdap 11/19/2008, 06/03/2009   02/15/12 Colonoscopy-repeat 10 years  Review of Systems  Constitutional: Negative.   HENT: Negative.   Eyes: Negative.   Respiratory: Negative.   Cardiovascular: Negative.   Gastrointestinal: Negative.   Endocrine: Negative.   Genitourinary: Negative.   Musculoskeletal: Negative.   Skin: Negative.   Allergic/Immunologic: Negative.   Neurological: Negative.   Hematological: Negative.   Psychiatric/Behavioral: Negative.     Social History   Socioeconomic History  . Marital status: Single    Spouse name: Not on file  . Number of children: Not on file  . Years of education: Not on file  . Highest education level: Not on file  Occupational History  . Not on file  Social Needs  . Financial resource strain: Not on file  . Food insecurity:    Worry: Not on file    Inability: Not on file  . Transportation needs:    Medical: Not on file    Non-medical: Not on file  Tobacco Use  . Smoking status: Never Smoker  . Smokeless tobacco: Never Used  Substance and Sexual Activity  . Alcohol use: Yes    Comment: one drink daily  . Drug use: No  . Sexual activity: Not on file  Lifestyle  . Physical activity:    Days per week: Not on file    Minutes per session: Not on file  .  Stress: Not on file  Relationships  . Social connections:    Talks on phone: Not on file    Gets together: Not on file    Attends religious service: Not on file    Active member of club or organization: Not on file    Attends meetings of clubs or organizations: Not on file    Relationship status: Not on file  . Intimate partner violence:    Fear of current or ex partner: Not on file    Emotionally abused: Not on file    Physically abused: Not on file    Forced sexual activity: Not on file  Other Topics Concern  . Not on file  Social History Narrative  . Not on file    Patient Active Problem List   Diagnosis Date Noted  . Chronic venous insufficiency 01/02/2015  . Essential (primary) hypertension 01/02/2015  . Fatty infiltration of liver 01/02/2015  . HLD (hyperlipidemia) 01/02/2015  . Adiposity 01/02/2015  . 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 Beat, premature ventricular 09/05/2014    Past Surgical History:  Procedure Laterality Date  . ANAL FISSURE REPAIR      His family history includes Breast cancer in his mother; CAD in his paternal grandfather; Colon polyps in his father; Diverticulitis in his father; Esophageal cancer in his mother; Heart attack  in his paternal grandfather; Hypertension in his father and sister; Lung cancer in his father.     Outpatient Encounter Medications as of 07/16/2018  Medication Sig  . amLODipine-olmesartan (AZOR) 10-40 MG tablet TAKE 1 TABLET BY MOUTH EVERY DAY  . hydrochlorothiazide (HYDRODIURIL) 25 MG tablet TAKE 1 TABLET BY MOUTH DAILY  . pravastatin (PRAVACHOL) 20 MG tablet Take 1 tablet (20 mg total) by mouth daily.  . hydrocortisone (ANUSOL-HC) 25 MG suppository Place 1 suppository (25 mg total) rectally at bedtime as needed for hemorrhoids or itching. (Patient not taking: Reported on 12/27/2017)  . ketoconazole (NIZORAL) 2 % cream Apply 1 application topically daily. (Patient not  taking: Reported on 12/27/2017)   No facility-administered encounter medications on file as of 07/16/2018.     Patient Care Team: Jerrol Banana., MD as PCP - General (Family Medicine)      Objective:   Vitals:  Vitals:   07/16/18 1525  BP: 130/72  Pulse: 72  Resp: 16  Temp: 98.2 F (36.8 C)  TempSrc: Oral  SpO2: 98%  Weight: 226 lb (102.5 kg)  Height: 5\' 10"  (1.778 m)    Physical Exam Constitutional:      Appearance: He is well-developed and well-nourished.  HENT:     Head: Normocephalic and atraumatic.     Right Ear: External ear normal.     Left Ear: External ear normal.     Nose: Nose normal.     Mouth/Throat:     Mouth: Oropharynx is clear and moist.  Eyes:     Extraocular Movements: EOM normal.     Conjunctiva/sclera: Conjunctivae normal.     Pupils: Pupils are equal, round, and reactive to light.  Neck:     Musculoskeletal: Normal range of motion and neck supple.  Cardiovascular:     Rate and Rhythm: Normal rate and regular rhythm.     Pulses: Intact distal pulses.     Heart sounds: Normal heart sounds.  Pulmonary:     Effort: Pulmonary effort is normal.     Breath sounds: Normal breath sounds.  Abdominal:     General: Bowel sounds are normal.     Palpations: Abdomen is soft.     Comments: Very small, nontender umbilical hernia  Genitourinary:    Penis: Normal.      Prostate: Normal.     Rectum: Normal.  Musculoskeletal: Normal range of motion.  Skin:    General: Skin is warm and dry.  Neurological:     Mental Status: He is alert and oriented to person, place, and time.  Psychiatric:        Mood and Affect: Mood and affect normal.        Behavior: Behavior normal.        Thought Content: Thought content normal.        Judgment: Judgment normal.    Fall Risk  07/16/2018 07/11/2017 04/14/2016  Falls in the past year? 0 No Yes  Comment - - Patient was stepping off of the lawn mower and fractured his ankle  Number falls in past yr: - - 1   Injury with Fall? - - Yes   Functional Status Survey: Is the patient deaf or have difficulty hearing?: No Does the patient have difficulty seeing, even when wearing glasses/contacts?: No Does the patient have difficulty concentrating, remembering, or making decisions?: No Does the patient have difficulty walking or climbing stairs?: No Does the patient have difficulty dressing or bathing?: No Does the patient have difficulty  doing errands alone such as visiting a doctor's office or shopping?: No   Depression Screen PHQ 2/9 Scores 07/16/2018 07/11/2017 04/14/2016  PHQ - 2 Score 0 0 0  PHQ- 9 Score - 0 -     Office Visit from 07/16/2018 in [place]  AUDIT-C Score  6       Assessment & Plan:     Routine Health Maintenance and Physical Exam  Exercise Activities and Dietary recommendations Goals   None     Immunization History  Administered Date(s) Administered  . Influenza,inj,Quad PF,6+ Mos 05/29/2014  . Influenza-Unspecified 06/09/2015, 05/21/2017, 04/08/2018  . Tdap 11/19/2008, 06/03/2009    Health Maintenance  Topic Date Due  . HIV Screening  10/29/1980  . INFLUENZA VACCINE  03/08/2018  . TETANUS/TDAP  06/04/2019  . COLONOSCOPY  07/21/2027     Discussed health benefits of physical activity, and encouraged him to engage in regular exercise appropriate for his age and condition.  1. Annual physical exam   2. Umbilical hernia without obstruction and without gangrene We will follow clinically for now.  Offered surgical referral at any point in time but I do not think it is necessary at this time.      HPI, Exam and A&P Transcribed under the direction and in the presence of Miguel Aschoff, Brooke Bonito., MD. Electronically Signed: Althea Charon, RMA I have done the exam and reviewed the above chart and it is accurate to the best of my knowledge. Development worker, community has been used in this note in any air is in the dictation or transcription are  unintentional. I have done the exam and reviewed the chart and it is accurate to the best of my knowledge. Development worker, community has been used and  any errors in dictation or transcription are unintentional. Miguel Aschoff M.D. [facility]

## 2018-10-03 ENCOUNTER — Encounter: Payer: BC Managed Care – PPO | Admitting: Family Medicine

## 2018-11-01 ENCOUNTER — Other Ambulatory Visit: Payer: Self-pay | Admitting: Family Medicine

## 2018-11-05 ENCOUNTER — Other Ambulatory Visit: Payer: Self-pay | Admitting: Family Medicine

## 2018-11-05 MED ORDER — HYDROCHLOROTHIAZIDE 25 MG PO TABS: 25.0000 mg | ORAL_TABLET | Freq: Every day | ORAL | 3 refills | Status: DC

## 2018-11-05 NOTE — Telephone Encounter (Signed)
[facility] faxed refill request for the following medications:  hydrochlorothiazide (HYDRODIURIL) 25 MG tablet    Please advise.  

## 2019-01-15 ENCOUNTER — Encounter: Payer: Self-pay | Admitting: Family Medicine

## 2019-01-15 ENCOUNTER — Ambulatory Visit: Payer: BC Managed Care – PPO | Admitting: Family Medicine

## 2019-01-15 ENCOUNTER — Other Ambulatory Visit: Payer: Self-pay

## 2019-01-15 VITALS — BP 126/71 | HR 78 | Temp 98.0°F | Resp 16 | Wt 224.8 lb

## 2019-01-15 DIAGNOSIS — Z125 Encounter for screening for malignant neoplasm of prostate: Secondary | ICD-10-CM

## 2019-01-15 DIAGNOSIS — E782 Mixed hyperlipidemia: Secondary | ICD-10-CM

## 2019-01-15 DIAGNOSIS — I10 Essential (primary) hypertension: Secondary | ICD-10-CM

## 2019-01-15 NOTE — Progress Notes (Signed)
Patient: Mason Henderson Male    DOB: 1965/09/15   53 y.o.   MRN: [id number] Visit Date: 01/15/2019  Today's Provider: Wilhemena Durie, MD   Chief Complaint  Patient presents with  . Hypertension   Subjective:     HPI  Hypertension, follow-up:  BP Readings from Last 3 Encounters:  01/15/19 126/71  07/16/18 130/72  12/27/17 124/70    He was last seen for hypertension 1 year ago.  BP at that visit was 124/70. Management changes since that visit include none. He reports excellent compliance with treatment. He is not having side effects.  He is exercising. He is adherent to low salt diet.   Outside blood pressures are not being checked. He is experiencing none.  Patient denies chest pain, chest pressure/discomfort, dyspnea, exertional chest pressure/discomfort, fatigue, irregular heart beat, lower extremity edema, near-syncope, orthopnea, palpitations, paroxysmal nocturnal dyspnea, syncope and tachypnea.   Cardiovascular risk factors include hypertension and male gender.  Use of agents associated with hypertension: none.     Weight trend: stable Wt Readings from Last 3 Encounters:  01/15/19 224 lb 12.8 oz (102 kg)  07/16/18 226 lb (102.5 kg)  12/27/17 223 lb (101.2 kg)    Current diet: well balanced  ------------------------------------------------------------------------  No Known Allergies   Current Outpatient Medications:  .  amLODipine-olmesartan (AZOR) 10-40 MG tablet, TAKE 1 TABLET BY MOUTH EVERY DAY, Disp: 90 tablet, Rfl: 3 .  hydrochlorothiazide (HYDRODIURIL) 25 MG tablet, Take 1 tablet (25 mg total) by mouth daily., Disp: 90 tablet, Rfl: 3 .  pravastatin (PRAVACHOL) 20 MG tablet, Take 1 tablet (20 mg total) by mouth daily., Disp: 90 tablet, Rfl: 3 .  hydrocortisone (ANUSOL-HC) 25 MG suppository, Place 1 suppository (25 mg total) rectally at bedtime as needed for hemorrhoids or itching. (Patient not taking: Reported on 12/27/2017), Disp: 12  suppository, Rfl: 1 .  ketoconazole (NIZORAL) 2 % cream, Apply 1 application topically daily. (Patient not taking: Reported on 01/15/2019), Disp: 15 g, Rfl: 0  Review of Systems  Constitutional: Negative.   HENT: Negative.   Respiratory: Negative.   Cardiovascular: Negative.   Gastrointestinal: Negative.   Genitourinary: Negative.   Musculoskeletal: Negative.   Neurological: Negative.   Psychiatric/Behavioral: Negative.     Social History   Tobacco Use  . Smoking status: Never Smoker  . Smokeless tobacco: Never Used  Substance Use Topics  . Alcohol use: Yes    Comment: one drink daily      Objective:   BP 126/71   Pulse 78   Temp 98 F (36.7 C) (Oral)   Resp 16   Wt 224 lb 12.8 oz (102 kg)   BMI 32.26 kg/m  Vitals:   01/15/19 0830  BP: 126/71  Pulse: 78  Resp: 16  Temp: 98 F (36.7 C)  TempSrc: Oral  Weight: 224 lb 12.8 oz (102 kg)     Physical Exam Vitals signs reviewed.  Constitutional:      Appearance: He is well-developed.  HENT:     Head: Normocephalic and atraumatic.     Right Ear: External ear normal.     Left Ear: External ear normal.     Nose: Nose normal.  Eyes:     Conjunctiva/sclera: Conjunctivae normal.     Pupils: Pupils are equal, round, and reactive to light.  Neck:     Musculoskeletal: Normal range of motion and neck supple.  Cardiovascular:     Rate and Rhythm: Normal rate and  regular rhythm.     Heart sounds: Normal heart sounds.  Pulmonary:     Effort: Pulmonary effort is normal.     Breath sounds: Normal breath sounds.  Abdominal:     General: Bowel sounds are normal.     Palpations: Abdomen is soft.     Comments: Very small, nontender umbilical hernia  Musculoskeletal: Normal range of motion.  Skin:    General: Skin is warm and dry.  Neurological:     Mental Status: He is alert and oriented to person, place, and time.  Psychiatric:        Behavior: Behavior normal.        Thought Content: Thought content normal.         Judgment: Judgment normal.         Assessment & Plan    1. Mixed hyperlipidemia RTC 6 months. - Lipid panel - Comprehensive metabolic panel - CBC with Differential/Platelet  2. Prostate cancer screening  - PSA  3. Essential (primary) hypertension Controlled. - Comprehensive metabolic panel - TSH      Richard Cranford Mon, MD  [facility] Clint Bolder as a scribe for Wilhemena Durie, MD.,have documented all relevant documentation on the behalf of Wilhemena Durie, MD,as directed by  Wilhemena Durie, MD while in the presence of Wilhemena Durie, MD.

## 2019-02-06 ENCOUNTER — Telehealth: Payer: Self-pay

## 2019-02-06 LAB — COMPREHENSIVE METABOLIC PANEL
ALT: 55 IU/L — ABNORMAL HIGH (ref 0–44)
AST: 35 IU/L (ref 0–40)
Albumin/Globulin Ratio: 2.2 (ref 1.2–2.2)
Albumin: 5 g/dL — ABNORMAL HIGH (ref 3.8–4.9)
Alkaline Phosphatase: 54 IU/L (ref 39–117)
BUN/Creatinine Ratio: 17 (ref 9–20)
BUN: 23 mg/dL (ref 6–24)
Bilirubin Total: 0.5 mg/dL (ref 0.0–1.2)
CO2: 23 mmol/L (ref 20–29)
Calcium: 10.4 mg/dL — ABNORMAL HIGH (ref 8.7–10.2)
Chloride: 99 mmol/L (ref 96–106)
Creatinine, Ser: 1.36 mg/dL — ABNORMAL HIGH (ref 0.76–1.27)
GFR calc Af Amer: 68 mL/min/{1.73_m2} (ref >59)
GFR calc non Af Amer: 59 mL/min/{1.73_m2} — ABNORMAL LOW (ref >59)
Globulin, Total: 2.3 g/dL (ref 1.5–4.5)
Glucose: 99 mg/dL (ref 65–99)
Potassium: 4.6 mmol/L (ref 3.5–5.2)
Sodium: 138 mmol/L (ref 134–144)
Total Protein: 7.3 g/dL (ref 6.0–8.5)

## 2019-02-06 LAB — LIPID PANEL
Chol/HDL Ratio: 3.3 ratio (ref 0.0–5.0)
Cholesterol, Total: 215 mg/dL — ABNORMAL HIGH (ref 100–199)
HDL: 66 mg/dL (ref >39)
LDL Calculated: 100 mg/dL — ABNORMAL HIGH (ref 0–99)
Triglycerides: 245 mg/dL — ABNORMAL HIGH (ref 0–149)
VLDL Cholesterol Cal: 49 mg/dL — ABNORMAL HIGH (ref 5–40)

## 2019-02-06 LAB — CBC WITH DIFFERENTIAL/PLATELET
Basophils Absolute: 0.1 10*3/uL (ref 0.0–0.2)
Basos: 1 %
EOS (ABSOLUTE): 0.2 10*3/uL (ref 0.0–0.4)
Eos: 4 %
Hematocrit: 44.5 % (ref 37.5–51.0)
Hemoglobin: 15.5 g/dL (ref 13.0–17.7)
Immature Grans (Abs): 0 10*3/uL (ref 0.0–0.1)
Immature Granulocytes: 0 %
Lymphocytes Absolute: 1.1 10*3/uL (ref 0.7–3.1)
Lymphs: 27 %
MCH: 31.8 pg (ref 26.6–33.0)
MCHC: 34.8 g/dL (ref 31.5–35.7)
MCV: 91 fL (ref 79–97)
Monocytes Absolute: 0.5 10*3/uL (ref 0.1–0.9)
Monocytes: 11 %
Neutrophils Absolute: 2.4 10*3/uL (ref 1.4–7.0)
Neutrophils: 57 %
Platelets: 239 10*3/uL (ref 150–450)
RBC: 4.88 x10E6/uL (ref 4.14–5.80)
RDW: 13.1 % (ref 11.6–15.4)
WBC: 4.3 10*3/uL (ref 3.4–10.8)

## 2019-02-06 LAB — TSH: TSH: 1.93 u[IU]/mL (ref 0.450–4.500)

## 2019-02-06 LAB — PSA: Prostate Specific Ag, Serum: 0.7 ng/mL (ref 0.0–4.0)

## 2019-02-06 NOTE — Telephone Encounter (Signed)
-----   Message from Jerrol Banana., MD sent at 02/06/2019 11:59 AM EDT ----- Labs stable--continue to work on habits.

## 2019-02-06 NOTE — Telephone Encounter (Signed)
Left message for patient to call back regarding lab results.

## 2019-02-06 NOTE — Telephone Encounter (Signed)
Patient notified of lab results

## 2019-02-10 ENCOUNTER — Other Ambulatory Visit: Payer: Self-pay | Admitting: Family Medicine

## 2019-02-21 ENCOUNTER — Telehealth: Payer: Self-pay

## 2019-02-21 DIAGNOSIS — R509 Fever, unspecified: Secondary | ICD-10-CM

## 2019-02-21 NOTE — Telephone Encounter (Signed)
Patient called requesting to be tested for Covid because he has a 99.3 temperature and lower back pain. He states that he has not been exposed to anyone with the virus.

## 2019-02-21 NOTE — Telephone Encounter (Signed)
Order has been placed in chart and advised patient.KW

## 2019-07-23 NOTE — Progress Notes (Signed)
Patient: Mason Henderson, Male    DOB: [birth year], 53 y.o.   MRN: [id number] Visit Date: 07/24/2019  Today's Provider: Wilhemena Durie, MD   Chief Complaint  Patient presents with  . Annual Exam   Subjective:     Annual physical exam Mason Henderson is a 53 y.o. male who presents today for health maintenance and complete physical. He feels well. He reports exercising some. He reports he is sleeping well.  -----------------------------------------------------------   Colonoscopy: 07/20/2017  Review of Systems  Constitutional: Negative.   HENT: Positive for postnasal drip.   Eyes: Negative.   Respiratory: Negative.   Cardiovascular: Negative.   Gastrointestinal: Negative.   Endocrine: Negative.   Genitourinary: Negative.   Musculoskeletal: Negative.   Skin: Negative.   Allergic/Immunologic: Negative.   Neurological: Negative.   Hematological: Negative.   Psychiatric/Behavioral: Negative.     Social History      He  reports that he has never smoked. He has never used smokeless tobacco. He reports current alcohol use. He reports that he does not use drugs.       Social History   Socioeconomic History  . Marital status: Single    Spouse name: Not on file  . Number of children: Not on file  . Years of education: Not on file  . Highest education level: Not on file  Occupational History  . Not on file  Tobacco Use  . Smoking status: Never Smoker  . Smokeless tobacco: Never Used  Substance and Sexual Activity  . Alcohol use: Yes    Comment: one drink daily  . Drug use: No  . Sexual activity: Not on file  Other Topics Concern  . Not on file  Social History Narrative  . Not on file   Social Determinants of Health   Financial Resource Strain:   . Difficulty of Paying Living Expenses: Not on file  Food Insecurity:   . Worried About Charity fundraiser in the Last Year: Not on file  . Ran Out of Food in the Last Year: Not on file  Transportation  Needs:   . Lack of Transportation (Medical): Not on file  . Lack of Transportation (Non-Medical): Not on file  Physical Activity:   . Days of Exercise per Week: Not on file  . Minutes of Exercise per Session: Not on file  Stress:   . Feeling of Stress : Not on file  Social Connections:   . Frequency of Communication with Friends and Family: Not on file  . Frequency of Social Gatherings with Friends and Family: Not on file  . Attends Religious Services: Not on file  . Active Member of Clubs or Organizations: Not on file  . Attends Archivist Meetings: Not on file  . Marital Status: Not on file    Past Medical History:  Diagnosis Date  . Fracture    left ankle  2017     Patient Active Problem List   Diagnosis Date Noted  . Chronic venous insufficiency 01/02/2015  . Essential (primary) hypertension 01/02/2015  . Fatty infiltration of liver 01/02/2015  . HLD (hyperlipidemia) 01/02/2015  . Adiposity 01/02/2015  . 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 Beat, premature ventricular 09/05/2014    Past Surgical History:  Procedure Laterality Date  . ANAL FISSURE REPAIR      Family History        Family Status  Relation Name Status  .  Mother  Deceased at age 79  . Father  Alive  . Sister  Alive  . Son  Alive  . PGF  (Not Specified)        His family history includes Breast cancer in his mother; CAD in his paternal grandfather; Colon polyps in his father; Diverticulitis in his father; Esophageal cancer in his mother; Heart attack in his paternal grandfather; Hypertension in his father and sister; Lung cancer in his father.      No Known Allergies   Current Outpatient Medications:  .  amLODipine-olmesartan (AZOR) 10-40 MG tablet, TAKE 1 TABLET BY MOUTH EVERY DAY, Disp: 90 tablet, Rfl: 3 .  hydrochlorothiazide (HYDRODIURIL) 25 MG tablet, Take 1 tablet (25 mg total) by mouth daily., Disp: 90 tablet, Rfl:  3 .  pravastatin (PRAVACHOL) 20 MG tablet, TAKE 1 TABLET(20 MG) BY MOUTH DAILY, Disp: 90 tablet, Rfl: 3 .  hydrocortisone (ANUSOL-HC) 25 MG suppository, Place 1 suppository (25 mg total) rectally at bedtime as needed for hemorrhoids or itching. (Patient not taking: Reported on 12/27/2017), Disp: 12 suppository, Rfl: 1 .  ketoconazole (NIZORAL) 2 % cream, Apply 1 application topically daily. (Patient not taking: Reported on 01/15/2019), Disp: 15 g, Rfl: 0   Patient Care Team: Jerrol Banana., MD as PCP - General (Family Medicine)    Objective:    Vitals: BP 122/79 (BP Location: Left Arm, Patient Position: Sitting, Cuff Size: Large)   Pulse 76   Temp (!) 97.1 F (36.2 C) (Other (Comment))   Resp 16   Ht 5\' 10"  (1.778 m)   Wt 226 lb (102.5 kg)   SpO2 96%   BMI 32.43 kg/m    Vitals:   07/24/19 0912  BP: 122/79  Pulse: 76  Resp: 16  Temp: (!) 97.1 F (36.2 C)  TempSrc: Other (Comment)  SpO2: 96%  Weight: 226 lb (102.5 kg)  Height: 5\' 10"  (1.778 m)     Physical Exam Vitals reviewed.  Constitutional:      Appearance: He is well-developed.  HENT:     Head: Normocephalic and atraumatic.     Right Ear: External ear normal.     Left Ear: External ear normal.     Nose: Nose normal.  Eyes:     Conjunctiva/sclera: Conjunctivae normal.     Pupils: Pupils are equal, round, and reactive to light.  Cardiovascular:     Rate and Rhythm: Normal rate and regular rhythm.     Heart sounds: Normal heart sounds.  Pulmonary:     Effort: Pulmonary effort is normal.     Breath sounds: Normal breath sounds.  Abdominal:     General: Bowel sounds are normal.     Palpations: Abdomen is soft.     Comments: Very small, nontender umbilical hernia  Genitourinary:    Penis: Normal.      Prostate: Normal.     Rectum: Normal.  Musculoskeletal:        General: Normal range of motion.     Cervical back: Normal range of motion and neck supple.  Skin:    General: Skin is warm and dry.   Neurological:     Mental Status: He is alert and oriented to person, place, and time.  Psychiatric:        Behavior: Behavior normal.        Thought Content: Thought content normal.        Judgment: Judgment normal.      Depression Screen PHQ 2/9 Scores 07/24/2019 07/16/2018  07/11/2017 04/14/2016  PHQ - 2 Score 0 0 0 0  PHQ- 9 Score 0 - 0 -       Assessment & Plan:     Routine Health Maintenance and Physical Exam  Exercise Activities and Dietary recommendations Goals   None     Immunization History  Administered Date(s) Administered  . Influenza,inj,Quad PF,6+ Mos 05/29/2014  . Influenza-Unspecified 06/09/2015, 05/21/2017, 04/08/2018  . Tdap 11/19/2008, 06/03/2009    Health Maintenance  Topic Date Due  . HIV Screening  10/29/1980  . INFLUENZA VACCINE  03/09/2019  . TETANUS/TDAP  06/04/2019  . COLONOSCOPY  07/21/2027     Discussed health benefits of physical activity, and encouraged him to engage in regular exercise appropriate for his age and condition.    --------------------------------------------------------------------  1. Annual physical exam Diet and exercise.  Labs obtained.  Return to clinic 1 year - POCT urinalysis dipstick  2. Need for Td vaccine  - Td : Tetanus/diphtheria >7yo Preservative  free  3. Essential (primary) hypertension   4. Umbilical hernia without obstruction and without gangrene Patient wishes to defer surgical referral.  This is still a very small hernia.    I, ,acting as a scribe for Wilhemena Durie, MD.,have documented all relevant documentation on the behalf of Wilhemena Durie, MD,as directed by  Wilhemena Durie, MD while in the presence of Wilhemena Durie, MD.    Wilhemena Durie, MD  [facility]

## 2019-07-24 ENCOUNTER — Encounter: Payer: Self-pay | Admitting: Family Medicine

## 2019-07-24 ENCOUNTER — Ambulatory Visit (INDEPENDENT_AMBULATORY_CARE_PROVIDER_SITE_OTHER): Payer: BC Managed Care – PPO | Admitting: Family Medicine

## 2019-07-24 ENCOUNTER — Other Ambulatory Visit: Payer: Self-pay

## 2019-07-24 VITALS — BP 122/79 | HR 76 | Temp 97.1°F | Resp 16 | Ht 70.0 in | Wt 226.0 lb

## 2019-07-24 DIAGNOSIS — Z23 Encounter for immunization: Secondary | ICD-10-CM | POA: Diagnosis not present

## 2019-07-24 DIAGNOSIS — K429 Umbilical hernia without obstruction or gangrene: Secondary | ICD-10-CM | POA: Diagnosis not present

## 2019-07-24 DIAGNOSIS — I10 Essential (primary) hypertension: Secondary | ICD-10-CM | POA: Diagnosis not present

## 2019-07-24 DIAGNOSIS — Z0000 Encounter for general adult medical examination without abnormal findings: Secondary | ICD-10-CM

## 2019-07-24 LAB — POCT URINALYSIS DIPSTICK
Appearance: NORMAL
Bilirubin, UA: NEGATIVE
Blood, UA: NEGATIVE
Glucose, UA: NEGATIVE
Ketones, UA: NEGATIVE
Leukocytes, UA: NEGATIVE
Nitrite, UA: NEGATIVE
Odor: NORMAL
Protein, UA: NEGATIVE
Spec Grav, UA: 1.02 (ref 1.010–1.025)
Urobilinogen, UA: 0.2 E.U./dL
pH, UA: 6 (ref 5.0–8.0)

## 2019-11-05 ENCOUNTER — Other Ambulatory Visit: Payer: Self-pay | Admitting: Family Medicine

## 2019-11-05 NOTE — Telephone Encounter (Signed)
Requested Prescriptions  Pending Prescriptions Disp Refills  . hydrochlorothiazide (HYDRODIURIL) 25 MG tablet [Pharmacy Med Name: HYDROCHLOROTHIAZIDE 25MG  TABLETS] 90 tablet 3    Sig: TAKE 1 TABLET(25 MG) BY MOUTH DAILY     Cardiovascular: Diuretics - Thiazide Failed - 11/05/2019 10:21 AM      Failed - Ca in normal range and within 360 days    Calcium  Date Value Ref Range Status  02/05/2019 10.4 (H) 8.7 - 10.2 mg/dL Final   Calcium, Ion  Date Value Ref Range Status  09/30/2015 5.3 4.5 - 5.6 mg/dL Final         Failed - Cr in normal range and within 360 days    Creatinine, Ser  Date Value Ref Range Status  02/05/2019 1.36 (H) 0.76 - 1.27 mg/dL Final         Passed - K in normal range and within 360 days    Potassium  Date Value Ref Range Status  02/05/2019 4.6 3.5 - 5.2 mmol/L Final         Passed - Na in normal range and within 360 days    Sodium  Date Value Ref Range Status  02/05/2019 138 134 - 144 mmol/L Final         Passed - Last BP in normal range    BP Readings from Last 1 Encounters:  07/24/19 122/79         Passed - Valid encounter within last 6 months    Recent Outpatient Visits          3 months ago Annual physical exam   [facility] Jerrol Banana., MD   9 months ago Mixed hyperlipidemia   [facility] Jerrol Banana., MD   1 year ago Annual physical exam   [facility] Jerrol Banana., MD   1 year ago Essential (primary) hypertension   [facility] Jerrol Banana., MD   2 years ago Annual physical exam   [facility] Jerrol Banana., MD      Future Appointments            In 3 months Jerrol Banana., MD [facility] [place]

## 2019-11-18 ENCOUNTER — Other Ambulatory Visit: Payer: Self-pay | Admitting: Family Medicine

## 2020-02-06 ENCOUNTER — Ambulatory Visit: Payer: Self-pay | Admitting: Family Medicine

## 2020-02-21 NOTE — Progress Notes (Signed)
Trena Platt Cummings,acting as a scribe for Wilhemena Durie, MD.,have documented all relevant documentation on the behalf of Wilhemena Durie, MD,as directed by  Wilhemena Durie, MD while in the presence of Wilhemena Durie, MD.  Established patient visit   Patient: Mason Henderson   DOB: March 24, 1966   53 y.o. Male  MRN: [id number] Visit Date: 02/24/2020  Today's healthcare provider: Wilhemena Durie, MD   Chief Complaint  Patient presents with  . Hypertension   Subjective    HPI  Overall patient is doing well.  He has done well with dietary changes and has lost a total of about 15 pounds this year.  He is down 9 pounds from his last visit here in December.  He might plan to retire in 3 years from his job at [employer]. Hypertension, follow-up  BP Readings from Last 3 Encounters:  02/24/20 115/73  07/24/19 122/79  01/15/19 126/71   Wt Readings from Last 3 Encounters:  02/24/20 217 lb 3.2 oz (98.5 kg)  07/24/19 226 lb (102.5 kg)  01/15/19 224 lb 12.8 oz (102 kg)     He was last seen for hypertension 7 months ago.  BP at that visit was 122/79. Management since that visit includes no changes.  He reports excellent compliance with treatment. He is not having side effects.  He is following a Regular diet. He is exercising. He does not smoke.  Use of agents associated with hypertension: none.   Outside blood pressures are being checked occasionally.  ---------------------------------------------------------------------------------------------------       Medications: Outpatient Medications Prior to Visit  Medication Sig  . amLODipine-olmesartan (AZOR) 10-40 MG tablet TAKE 1 TABLET BY MOUTH EVERY DAY  . hydrochlorothiazide (HYDRODIURIL) 25 MG tablet TAKE 1 TABLET(25 MG) BY MOUTH DAILY  . hydrocortisone (ANUSOL-HC) 25 MG suppository Place 1 suppository (25 mg total) rectally at bedtime as needed for hemorrhoids or itching. (Patient not taking: Reported on 12/27/2017)   . ketoconazole (NIZORAL) 2 % cream Apply 1 application topically daily. (Patient not taking: Reported on 01/15/2019)  . pravastatin (PRAVACHOL) 20 MG tablet TAKE 1 TABLET(20 MG) BY MOUTH DAILY (Patient not taking: Reported on 02/24/2020)   No facility-administered medications prior to visit.    Review of Systems  All other systems reviewed and are negative.      Objective    BP 115/73 (BP Location: Right Arm, Patient Position: Sitting, Cuff Size: Large)   Pulse 73   Temp (!) 97.5 F (36.4 C) (Temporal)   Ht 5\' 10"  (1.778 m)   Wt 217 lb 3.2 oz (98.5 kg)   BMI 31.16 kg/m  BP Readings from Last 3 Encounters:  02/24/20 115/73  07/24/19 122/79  01/15/19 126/71   Wt Readings from Last 3 Encounters:  02/24/20 217 lb 3.2 oz (98.5 kg)  07/24/19 226 lb (102.5 kg)  01/15/19 224 lb 12.8 oz (102 kg)      Physical Exam Vitals reviewed.  Constitutional:      Appearance: He is well-developed.  HENT:     Head: Normocephalic and atraumatic.     Right Ear: External ear normal.     Left Ear: External ear normal.     Nose: Nose normal.  Eyes:     Conjunctiva/sclera: Conjunctivae normal.     Pupils: Pupils are equal, round, and reactive to light.  Cardiovascular:     Rate and Rhythm: Normal rate and regular rhythm.     Heart sounds: Normal heart sounds.  Pulmonary:  Effort: Pulmonary effort is normal.     Breath sounds: Normal breath sounds.  Abdominal:     General: Bowel sounds are normal.     Palpations: Abdomen is soft.     Comments: Very small, nontender umbilical hernia  Musculoskeletal:        General: Normal range of motion.     Cervical back: Normal range of motion and neck supple.  Skin:    General: Skin is warm and dry.  Neurological:     General: No focal deficit present.     Mental Status: He is alert and oriented to person, place, and time.  Psychiatric:        Mood and Affect: Mood normal.        Behavior: Behavior normal.        Thought Content: Thought  content normal.        Judgment: Judgment normal.       No results found for any visits on 02/24/20.  Assessment & Plan     1. Essential (primary) hypertension Good control and weight loss. 2. Fatty infiltration of liver Patient working on diet and exercise and has lost 15 pounds this year.  3. Mixed hyperlipidemia Follow-up on next visit  4. Class 1 obesity due to excess calories with serious comorbidity and body mass index (BMI) of 31.0 to 31.9 in adult With hypertension hyperlipidemia and fatty liver  No follow-ups on file.      I, Wilhemena Durie, MD, have reviewed all documentation for this visit. The documentation on 02/25/20 for the exam, diagnosis, procedures, and orders are all accurate and complete.    Janaya Broy Cranford Mon, MD  [facility] [phone] (phone) [phone] (fax)  [place]

## 2020-02-24 ENCOUNTER — Other Ambulatory Visit: Payer: Self-pay

## 2020-02-24 ENCOUNTER — Encounter: Payer: Self-pay | Admitting: Family Medicine

## 2020-02-24 ENCOUNTER — Ambulatory Visit: Payer: BC Managed Care – PPO | Admitting: Family Medicine

## 2020-02-24 VITALS — BP 115/73 | HR 73 | Temp 97.5°F | Ht 70.0 in | Wt 217.2 lb

## 2020-02-24 DIAGNOSIS — K760 Fatty (change of) liver, not elsewhere classified: Secondary | ICD-10-CM

## 2020-02-24 DIAGNOSIS — Z6831 Body mass index (BMI) 31.0-31.9, adult: Secondary | ICD-10-CM

## 2020-02-24 DIAGNOSIS — E6609 Other obesity due to excess calories: Secondary | ICD-10-CM

## 2020-02-24 DIAGNOSIS — I10 Essential (primary) hypertension: Secondary | ICD-10-CM | POA: Diagnosis not present

## 2020-02-24 DIAGNOSIS — E782 Mixed hyperlipidemia: Secondary | ICD-10-CM | POA: Diagnosis not present

## 2020-07-24 ENCOUNTER — Telehealth: Payer: Self-pay

## 2020-07-24 NOTE — Telephone Encounter (Signed)
Copied from [place] [id number]. Topic: Appointment Scheduling - Scheduling Inquiry for Clinic >> Jul 24, 2020  3:13 PM Oneta Rack wrote: Reason for CRM: patient had to [facility] his 07/29/2020 physical. PCP next available is not until March, patient would like to know if he can be work in possible sooner, please advise (placed patient on wait list)

## 2020-07-27 NOTE — Telephone Encounter (Signed)
This is Dr. Alben Spittle patient.

## 2020-07-28 NOTE — Telephone Encounter (Signed)
Appt scheduled on 08/24/20. No available appt before then.

## 2020-07-29 ENCOUNTER — Encounter: Payer: Self-pay | Admitting: Family Medicine

## 2020-08-24 ENCOUNTER — Encounter: Payer: BC Managed Care – PPO | Admitting: Family Medicine

## 2020-09-10 ENCOUNTER — Encounter: Payer: Self-pay | Admitting: Family Medicine

## 2020-09-10 ENCOUNTER — Other Ambulatory Visit: Payer: Self-pay

## 2020-09-10 ENCOUNTER — Ambulatory Visit (INDEPENDENT_AMBULATORY_CARE_PROVIDER_SITE_OTHER): Payer: BC Managed Care – PPO | Admitting: Family Medicine

## 2020-09-10 VITALS — BP 120/73 | HR 73 | Temp 98.0°F | Resp 16 | Ht 70.0 in | Wt 219.0 lb

## 2020-09-10 DIAGNOSIS — Z1389 Encounter for screening for other disorder: Secondary | ICD-10-CM

## 2020-09-10 DIAGNOSIS — Z0000 Encounter for general adult medical examination without abnormal findings: Secondary | ICD-10-CM | POA: Diagnosis not present

## 2020-09-10 DIAGNOSIS — Z125 Encounter for screening for malignant neoplasm of prostate: Secondary | ICD-10-CM

## 2020-09-10 DIAGNOSIS — I10 Essential (primary) hypertension: Secondary | ICD-10-CM | POA: Diagnosis not present

## 2020-09-10 DIAGNOSIS — E782 Mixed hyperlipidemia: Secondary | ICD-10-CM | POA: Diagnosis not present

## 2020-09-10 LAB — POCT URINALYSIS DIPSTICK
Bilirubin, UA: NEGATIVE
Blood, UA: NEGATIVE
Glucose, UA: NEGATIVE
Ketones, UA: NEGATIVE
Leukocytes, UA: NEGATIVE
Nitrite, UA: NEGATIVE
Protein, UA: NEGATIVE
Spec Grav, UA: 1.02 (ref 1.010–1.025)
Urobilinogen, UA: 0.2 E.U./dL
pH, UA: 6.5 (ref 5.0–8.0)

## 2020-09-10 LAB — IFOBT (OCCULT BLOOD): IFOBT: NEGATIVE

## 2020-09-10 NOTE — Progress Notes (Signed)
Complete physical exam   Patient: Mason Henderson   DOB: September 13, 1965   55 y.o. Male  MRN: [id number] Visit Date: 09/10/2020  Today's healthcare provider: Wilhemena Durie, MD   Chief Complaint  Patient presents with  . Annual Exam   Subjective    Mason Henderson is a 55 y.o. male who presents today for a complete physical exam.  He reports consuming a general diet. The patient does not participate in regular exercise at present. He generally feels well. He reports sleeping well. He does not have additional problems to discuss today.  He has some increased less recently as his wife is having significant kidney problems and might have to have a nephrectomy and his father-in-law has stage IV prostate cancer.  Past Medical History:  Diagnosis Date  . Fracture    left ankle April 2017   Past Surgical History:  Procedure Laterality Date  . ANAL FISSURE REPAIR     Social History   Socioeconomic History  . Marital status: Single    Spouse name: Not on file  . Number of children: Not on file  . Years of education: Not on file  . Highest education level: Not on file  Occupational History  . Not on file  Tobacco Use  . Smoking status: Never Smoker  . Smokeless tobacco: Never Used  Substance and Sexual Activity  . Alcohol use: Yes    Comment: one drink daily  . Drug use: No  . Sexual activity: Not on file  Other Topics Concern  . Not on file  Social History Narrative  . Not on file   Social Determinants of Health   Financial Resource Strain: Not on file  Food Insecurity: Not on file  Transportation Needs: Not on file  Physical Activity: Not on file  Stress: Not on file  Social Connections: Not on file  Intimate Partner Violence: Not on file   Family Status  Relation Name Status  . Mother  Deceased at age 58  . Father  Alive  . Sister  Alive  . Son  Alive  . PGF  (Not Specified)   Family History  Problem Relation Age of Onset  . Breast cancer Mother    . Esophageal cancer Mother   . Lung cancer Father   . Hypertension Father   . Diverticulitis Father   . Colon polyps Father   . Hypertension Sister   . Heart attack Paternal Grandfather   . CAD Paternal Grandfather    No Known Allergies  Patient Care Team: Jerrol Banana., MD as PCP - General (Family Medicine)   Medications: Outpatient Medications Prior to Visit  Medication Sig  . amLODipine-olmesartan (AZOR) 10-40 MG tablet TAKE 1 TABLET BY MOUTH EVERY DAY  . hydrochlorothiazide (HYDRODIURIL) 25 MG tablet TAKE 1 TABLET(25 MG) BY MOUTH DAILY  . hydrocortisone (ANUSOL-HC) 25 MG suppository Place 1 suppository (25 mg total) rectally at bedtime as needed for hemorrhoids or itching. (Patient not taking: No sig reported)  . ketoconazole (NIZORAL) 2 % cream Apply 1 application topically daily. (Patient not taking: No sig reported)  . pravastatin (PRAVACHOL) 20 MG tablet TAKE 1 TABLET(20 MG) BY MOUTH DAILY (Patient not taking: No sig reported)   No facility-administered medications prior to visit.    Review of Systems  All other systems reviewed and are negative.      Objective    BP 120/73   Pulse 73   Temp 98 F (36.7 C)  Resp 16   Ht 5\' 10"  (1.778 m)   Wt 219 lb (99.3 kg)   BMI 31.42 kg/m  BP Readings from Last 3 Encounters:  09/10/20 120/73  02/24/20 115/73  07/24/19 122/79   Wt Readings from Last 3 Encounters:  09/10/20 219 lb (99.3 kg)  02/24/20 217 lb 3.2 oz (98.5 kg)  07/24/19 226 lb (102.5 kg)      Physical Exam Vitals reviewed.  Constitutional:      Appearance: He is well-developed.  HENT:     Head: Normocephalic and atraumatic.     Right Ear: External ear normal.     Left Ear: External ear normal.     Nose: Nose normal.  Eyes:     Conjunctiva/sclera: Conjunctivae normal.     Pupils: Pupils are equal, round, and reactive to light.  Cardiovascular:     Rate and Rhythm: Normal rate and regular rhythm.     Heart sounds: Normal heart sounds.   Pulmonary:     Effort: Pulmonary effort is normal.     Breath sounds: Normal breath sounds.  Abdominal:     General: Bowel sounds are normal.     Palpations: Abdomen is soft.     Comments: Very small, nontender umbilical hernia  Genitourinary:    Penis: Normal.      Testes: Normal.     Prostate: Normal.     Rectum: Normal.  Musculoskeletal:        General: Normal range of motion.     Cervical back: Normal range of motion and neck supple.  Skin:    General: Skin is warm and dry.  Neurological:     Mental Status: He is alert and oriented to person, place, and time.  Psychiatric:        Behavior: Behavior normal.        Thought Content: Thought content normal.        Judgment: Judgment normal.       Last depression screening scores PHQ 2/9 Scores 09/10/2020 07/24/2019 07/16/2018  PHQ - 2 Score 0 0 0  PHQ- 9 Score 0 0 -   Last fall risk screening Fall Risk  09/10/2020  Falls in the past year? 0  Comment -  Number falls in past yr: 0  Injury with Fall? 0  Risk for fall due to : No Fall Risks  Follow up Falls evaluation completed   Last Audit-C alcohol use screening Alcohol Use Disorder Test (AUDIT) 09/10/2020  1. How often do you have a drink containing alcohol? 4  2. How many drinks containing alcohol do you have on a typical day when you are drinking? 2  3. How often do you have six or more drinks on one occasion? 2  AUDIT-C Score 8  4. How often during the last year have you found that you were not able to stop drinking once you had started? 0  5. How often during the last year have you failed to do what was normally expected from you because of drinking? 0  6. How often during the last year have you needed a first drink in the morning to get yourself going after a heavy drinking session? 0  7. How often during the last year have you had a feeling of guilt of remorse after drinking? 0  8. How often during the last year have you been unable to remember what happened the  night before because you had been drinking? 0  9. Have you or someone else  been injured as a result of your drinking? 0  10. Has a relative or friend or a doctor or another health worker been concerned about your drinking or suggested you cut down? 0  Alcohol Use Disorder Identification Test Final Score (AUDIT) 8   A score of 3 or more in women, and 4 or more in men indicates increased risk for alcohol abuse, EXCEPT if all of the points are from question 1   No results found for any visits on 09/10/20.  Assessment & Plan    Routine Health Maintenance and Physical Exam  Exercise Activities and Dietary recommendations Goals   None     Immunization History  Administered Date(s) Administered  . Influenza,inj,Quad PF,6+ Mos 05/29/2014  . Influenza-Unspecified 06/09/2015, 05/21/2017, 04/08/2018  . PFIZER(Purple Top)SARS-COV-2 Vaccination 01/10/2020, 01/31/2020  . Td 07/24/2019  . Tdap 11/19/2008, 06/03/2009    Health Maintenance  Topic Date Due  . Hepatitis C Screening  Never done  . HIV Screening  Never done  . COVID-19 Vaccine (3 - Pfizer risk 4-dose series) 02/28/2020  . INFLUENZA VACCINE  03/08/2020  . COLONOSCOPY (Pts 45-42yrs Insurance coverage will need to be confirmed)  07/21/2027  . TETANUS/TDAP  07/23/2029    Discussed health benefits of physical activity, and encouraged him to engage in regular exercise appropriate for his age and condition.  1. Annual physical exam  - IFOBT POC (occult bld, rslt in office); Future - IFOBT POC (occult bld, rslt in office)  2. Essential (primary) hypertension  - CBC w/Diff/Platelet - Comprehensive Metabolic Panel (CMET) - TSH  3. Mixed hyperlipidemia  - Lipid panel  4. Prostate cancer screening  - PSA  5. Screening for blood or protein in urine  - POCT urinalysis dipstick   No follow-ups on file.     I, Wilhemena Durie, MD, have reviewed all documentation for this visit. The documentation on 09/18/20 for the  exam, diagnosis, procedures, and orders are all accurate and complete.    Kohen Reither Cranford Mon, MD  [facility] [phone] (phone) [phone] (fax)  [place]

## 2020-09-30 LAB — CBC WITH DIFFERENTIAL/PLATELET
Basophils Absolute: 0 10*3/uL (ref 0.0–0.2)
Basos: 1 %
EOS (ABSOLUTE): 0.2 10*3/uL (ref 0.0–0.4)
Eos: 5 %
Hematocrit: 44.5 % (ref 37.5–51.0)
Hemoglobin: 15.5 g/dL (ref 13.0–17.7)
Immature Grans (Abs): 0 10*3/uL (ref 0.0–0.1)
Immature Granulocytes: 0 %
Lymphocytes Absolute: 1.2 10*3/uL (ref 0.7–3.1)
Lymphs: 31 %
MCH: 31.4 pg (ref 26.6–33.0)
MCHC: 34.8 g/dL (ref 31.5–35.7)
MCV: 90 fL (ref 79–97)
Monocytes Absolute: 0.4 10*3/uL (ref 0.1–0.9)
Monocytes: 9 %
Neutrophils Absolute: 2.1 10*3/uL (ref 1.4–7.0)
Neutrophils: 54 %
Platelets: 251 10*3/uL (ref 150–450)
RBC: 4.93 x10E6/uL (ref 4.14–5.80)
RDW: 12.5 % (ref 11.6–15.4)
WBC: 3.9 10*3/uL (ref 3.4–10.8)

## 2020-09-30 LAB — COMPREHENSIVE METABOLIC PANEL
ALT: 50 IU/L — ABNORMAL HIGH (ref 0–44)
AST: 37 IU/L (ref 0–40)
Albumin/Globulin Ratio: 2 (ref 1.2–2.2)
Albumin: 5 g/dL — ABNORMAL HIGH (ref 3.8–4.9)
Alkaline Phosphatase: 50 IU/L (ref 44–121)
BUN/Creatinine Ratio: 12 (ref 9–20)
BUN: 15 mg/dL (ref 6–24)
Bilirubin Total: 0.5 mg/dL (ref 0.0–1.2)
CO2: 22 mmol/L (ref 20–29)
Calcium: 10.4 mg/dL — ABNORMAL HIGH (ref 8.7–10.2)
Chloride: 101 mmol/L (ref 96–106)
Creatinine, Ser: 1.23 mg/dL (ref 0.76–1.27)
GFR calc Af Amer: 76 mL/min/{1.73_m2} (ref >59)
GFR calc non Af Amer: 66 mL/min/{1.73_m2} (ref >59)
Globulin, Total: 2.5 g/dL (ref 1.5–4.5)
Glucose: 84 mg/dL (ref 65–99)
Potassium: 4.5 mmol/L (ref 3.5–5.2)
Sodium: 142 mmol/L (ref 134–144)
Total Protein: 7.5 g/dL (ref 6.0–8.5)

## 2020-09-30 LAB — TSH: TSH: 1.64 u[IU]/mL (ref 0.450–4.500)

## 2020-09-30 LAB — LIPID PANEL
Chol/HDL Ratio: 2.7 ratio (ref 0.0–5.0)
Cholesterol, Total: 225 mg/dL — ABNORMAL HIGH (ref 100–199)
HDL: 82 mg/dL (ref >39)
LDL Chol Calc (NIH): 116 mg/dL — ABNORMAL HIGH (ref 0–99)
Triglycerides: 155 mg/dL — ABNORMAL HIGH (ref 0–149)
VLDL Cholesterol Cal: 27 mg/dL (ref 5–40)

## 2020-09-30 LAB — PSA: Prostate Specific Ag, Serum: 0.8 ng/mL (ref 0.0–4.0)

## 2020-11-05 ENCOUNTER — Other Ambulatory Visit: Payer: Self-pay | Admitting: Family Medicine

## 2020-11-11 ENCOUNTER — Encounter: Payer: Self-pay | Admitting: Family Medicine

## 2021-08-17 ENCOUNTER — Ambulatory Visit: Payer: Self-pay | Admitting: *Deleted

## 2021-08-17 NOTE — Telephone Encounter (Signed)
° °  Chief Complaint: requesting appt . Low abdominal pain with frequent urination Symptoms: low abd. Pain below umbilicus above pubic bone , frequent urination. Hx umbilical hernia  Frequency: 2 weeks  Pertinent Negatives: Patient denies fever, constant pain, odor to urine or burning urination Disposition: [] ED /[] Urgent Care (no appt availability in office) / [x] Appointment(In office/virtual)/ []  [facility]/ [] Home Care/ [] Refused Recommended Disposition /[] [place] [facility]/ []  Follow-up with PCP Additional Notes:  Contact FC scheduled appt for tomorrow . Patient aware if symptoms worsen to go to ED.        Reason for Disposition  [1] MILD pain (e.g., does not interfere with normal activities) AND [2] pain comes and goes (cramps) [3] present > 48 hours  (Exception: this same abdominal pain is a chronic symptom recurrent or ongoing AND present > 4 weeks)  Answer Assessment - Initial Assessment Questions 1. LOCATION: "Where does it hurt?"      Below naval above pubic bone  2. RADIATION: "Does the pain shoot anywhere else?" (e.g., chest, back)     No  3. ONSET: "When did the pain begin?" (Minutes, hours or days ago)      2 weeks  4. SUDDEN: "Gradual or sudden onset?"     Sudden but comes and goes  5. PATTERN "Does the pain come and go, or is it constant?"    - If constant: "Is it getting better, staying the same, or worsening?"      (Note: Constant means the pain never goes away completely; most serious pain is constant and it progresses)     - If intermittent: "How long does it last?" "Do you have pain now?"     (Note: Intermittent means the pain goes away completely between bouts)     Comes and goes feeling urge to urinate  6. SEVERITY: "How bad is the pain?"  (e.g., Scale 1-10; mild, moderate, or severe)    - MILD (1-3): doesn't interfere with normal activities, abdomen soft and not tender to touch     - MODERATE (4-7): interferes with normal activities or awakens  from sleep, abdomen tender to touch     - SEVERE (8-10): excruciating pain, doubled over, unable to do any normal activities       Mild  7. RECURRENT SYMPTOM: "Have you ever had this type of stomach pain before?" If Yes, ask: "When was the last time?" and "What happened that time?"      Yes umbilical hernia  8. CAUSE: "What do you think is causing the stomach pain?"     Not sure  9. RELIEVING/AGGRAVATING FACTORS: "What makes it better or worse?" (e.g., movement, antacids, bowel movement)     Na  10. OTHER SYMPTOMS: "Do you have any other symptoms?" (e.g., back pain, diarrhea, fever, urination pain, vomiting)       Low abdomen , frequent urination  Protocols used: Abdominal Pain - Male-A-AH

## 2021-08-18 ENCOUNTER — Other Ambulatory Visit: Payer: Self-pay

## 2021-08-18 ENCOUNTER — Encounter: Payer: Self-pay | Admitting: Family Medicine

## 2021-08-18 ENCOUNTER — Ambulatory Visit: Payer: BC Managed Care – PPO | Admitting: Family Medicine

## 2021-08-18 VITALS — BP 119/80 | HR 66 | Temp 97.8°F | Resp 16 | Wt 212.1 lb

## 2021-08-18 DIAGNOSIS — R1030 Lower abdominal pain, unspecified: Secondary | ICD-10-CM | POA: Diagnosis not present

## 2021-08-18 DIAGNOSIS — R350 Frequency of micturition: Secondary | ICD-10-CM

## 2021-08-18 DIAGNOSIS — N3289 Other specified disorders of bladder: Secondary | ICD-10-CM | POA: Diagnosis not present

## 2021-08-18 DIAGNOSIS — R1033 Periumbilical pain: Secondary | ICD-10-CM

## 2021-08-18 LAB — POCT URINALYSIS DIPSTICK
Bilirubin, UA: NEGATIVE
Blood, UA: NEGATIVE
Glucose, UA: NEGATIVE
Ketones, UA: NEGATIVE
Leukocytes, UA: NEGATIVE
Nitrite, UA: NEGATIVE
Protein, UA: NEGATIVE
Spec Grav, UA: 1.02 (ref 1.010–1.025)
Urobilinogen, UA: 0.2 E.U./dL
pH, UA: 6 (ref 5.0–8.0)

## 2021-08-18 NOTE — Progress Notes (Signed)
Established patient visit   Patient: Mason Henderson   DOB: January 30, 1966   56 y.o. Male  MRN: [id number] Visit Date: 08/18/2021  Today's healthcare provider: Gwyneth Sprout, FNP    I,Joseline E Rosas,acting as a scribe for Gwyneth Sprout, FNP.,have documented all relevant documentation on the behalf of Gwyneth Sprout, FNP,as directed by  Gwyneth Sprout, FNP while in the presence of Gwyneth Sprout, FNP.   Chief Complaint  Patient presents with   Abdominal Pain   Subjective    Abdominal Pain This is a new problem. The current episode started 1 to 4 weeks ago. The onset quality is sudden. The problem occurs daily. The problem has been unchanged. The pain is located in the suprapubic region. The quality of the pain is dull. The abdominal pain does not radiate. Associated symptoms include frequency. Associated symptoms comments: Burning after voiding. Exacerbated by: some with sitting. The pain is relieved by Nothing. He has tried nothing for the symptoms.     Medications: Outpatient Medications Prior to Visit  Medication Sig   amLODipine-olmesartan (AZOR) 10-40 MG tablet TAKE 1 TABLET BY MOUTH EVERY DAY   hydrochlorothiazide (HYDRODIURIL) 25 MG tablet TAKE 1 TABLET(25 MG) BY MOUTH DAILY   hydrocortisone (ANUSOL-HC) 25 MG suppository Place 1 suppository (25 mg total) rectally at bedtime as needed for hemorrhoids or itching.   ketoconazole (NIZORAL) 2 % cream Apply 1 application topically daily.   pravastatin (PRAVACHOL) 20 MG tablet TAKE 1 TABLET(20 MG) BY MOUTH DAILY   No facility-administered medications prior to visit.    Review of Systems  Gastrointestinal:  Positive for abdominal pain.  Genitourinary:  Positive for frequency.      Objective    BP 119/80 (BP Location: Right Arm, Patient Position: Sitting, Cuff Size: Large)    Pulse 66    Temp 97.8 F (36.6 C) (Oral)    Resp 16    Wt 212 lb 1.6 oz (96.2 kg)    BMI 30.43 kg/m    Physical Exam Vitals and nursing note  reviewed.  Constitutional:      General: He is not in acute distress.    Appearance: He is well-developed. He is obese. He is not ill-appearing, toxic-appearing or diaphoretic.  Cardiovascular:     Rate and Rhythm: Normal rate and regular rhythm.     Heart sounds: Normal heart sounds.  Pulmonary:     Effort: Pulmonary effort is normal.  Abdominal:     General: Abdomen is flat. Bowel sounds are normal. There is no distension or abdominal bruit. There are no signs of injury.     Palpations: Abdomen is soft. There is no mass.     Tenderness: There is abdominal tenderness in the periumbilical area. There is no right CVA tenderness, left CVA tenderness, guarding or rebound. Negative signs include Murphy's sign, Rovsing's sign, McBurney's sign, psoas sign and obturator sign.     Hernia: A hernia is present. Hernia is present in the umbilical area and ventral area.  Skin:    General: Skin is warm and dry.     Capillary Refill: Capillary refill takes less than 2 seconds.  Neurological:     General: No focal deficit present.     Mental Status: He is alert.  Psychiatric:        Mood and Affect: Mood normal.      Results for orders placed or performed in visit on 08/18/21  POCT urinalysis dipstick  Result Value  Ref Range   Color, UA Yellow    Clarity, UA Clear    Glucose, UA Negative Negative   Bilirubin, UA Negative    Ketones, UA Negative    Spec Grav, UA 1.020 1.010 - 1.025   Blood, UA Negative    pH, UA 6.0 5.0 - 8.0   Protein, UA Negative Negative   Urobilinogen, UA 0.2 0.2 or 1.0 E.U./dL   Nitrite, UA Negative    Leukocytes, UA Negative Negative   Appearance     Odor      Assessment & Plan     Problem List Items Addressed This Visit       Other   Lower abdominal pain - Primary    Acute abdominal pain s/p heavy lifting on 74/94 Hx of umbilical hernia; stable prior No smoking hx No change in bowel patterns No travel      Relevant Orders   POCT urinalysis dipstick  (Completed)   CT Abdomen Pelvis Wo Contrast   Frequent urination    Urine dip performed; negative Likely weight of possible expansion of hernia into inguinal canal      Relevant Orders   POCT urinalysis dipstick (Completed)   Periumbilical abdominal pain    C/f potential expansion of hernia into inguinal canal- causing pressure on bladder      Relevant Orders   CT Abdomen Pelvis Wo Contrast   Bladder spasms    No UTI on dip Negative CVA tenderness No pain on exam at bladder with exert pressure        No follow-ups on file.      Vonna Kotyk, FNP, have reviewed all documentation for this visit. The documentation on 08/18/21 for the exam, diagnosis, procedures, and orders are all accurate and complete.    Gwyneth Sprout, [place] [phone] (phone) [phone] (fax)  [place]

## 2021-08-18 NOTE — Assessment & Plan Note (Signed)
Acute abdominal pain s/p heavy lifting on 40/37 Hx of umbilical hernia; stable prior No smoking hx No change in bowel patterns No travel

## 2021-08-18 NOTE — Addendum Note (Signed)
Addended by: Doristine Devoid on: 08/18/2021 04:32 PM   Modules accepted: Orders

## 2021-08-18 NOTE — Patient Instructions (Signed)
Refer to CT scan for concern for expansion of hernia No hx of smoking, control BP 9-1-1 information provided on AA Plan to follow up with uro if bladder spasms remain

## 2021-08-18 NOTE — Assessment & Plan Note (Signed)
Urine dip performed; negative Likely weight of possible expansion of hernia into inguinal canal

## 2021-08-18 NOTE — Assessment & Plan Note (Signed)
No UTI on dip Negative CVA tenderness No pain on exam at bladder with exert pressure

## 2021-08-18 NOTE — Assessment & Plan Note (Signed)
C/f potential expansion of hernia into inguinal canal- causing pressure on bladder

## 2021-08-19 ENCOUNTER — Other Ambulatory Visit: Payer: Self-pay

## 2021-08-19 ENCOUNTER — Other Ambulatory Visit: Payer: Self-pay | Admitting: Family Medicine

## 2021-08-19 ENCOUNTER — Ambulatory Visit
Admission: RE | Admit: 2021-08-19 | Discharge: 2021-08-19 | Disposition: A | Discharge status: Home / Self Care | Payer: BC Managed Care – PPO | Source: Ambulatory Visit | Attending: Family Medicine | Admitting: Family Medicine

## 2021-08-19 DIAGNOSIS — R1033 Periumbilical pain: Secondary | ICD-10-CM | POA: Diagnosis present

## 2021-08-19 DIAGNOSIS — M4310 Spondylolisthesis, site unspecified: Secondary | ICD-10-CM | POA: Insufficient documentation

## 2021-08-19 DIAGNOSIS — N400 Enlarged prostate without lower urinary tract symptoms: Secondary | ICD-10-CM | POA: Insufficient documentation

## 2021-08-19 DIAGNOSIS — R1030 Lower abdominal pain, unspecified: Secondary | ICD-10-CM | POA: Diagnosis present

## 2021-08-19 DIAGNOSIS — M4300 Spondylolysis, site unspecified: Secondary | ICD-10-CM | POA: Insufficient documentation

## 2021-08-19 MED ORDER — MELOXICAM 7.5 MG PO TABS: 7.5000 mg | ORAL_TABLET | Freq: Every day | ORAL | 0 refills | Status: DC

## 2021-08-19 MED ORDER — TAMSULOSIN HCL 0.4 MG PO CAPS: 0.4000 mg | ORAL_CAPSULE | Freq: Every day | ORAL | 3 refills | Status: DC

## 2021-08-19 NOTE — Progress Notes (Signed)
Hi Greg,  No expansion of hernia which is wonderful.   Scan did show some back changes- change in shape of vertebra- and mild softening.   It also showed an enlarged prostate and a fatty liver.   I would recommend starting a high dose anti inflammatory medication for the back pain- and use the belt at work to prevent re-injury. Dietary changes are best to assist with fatty liver reduction. Exercise can help as well. Can start a medication to assist your bladder muscle with your enlarged prostate- which may be why you were having urinary complaints.  Please let us know if you have any questions.  Thank you,  Tally Joe, FNP

## 2021-08-20 LAB — URINE CULTURE: Organism ID, Bacteria: NO GROWTH

## 2021-09-02 ENCOUNTER — Ambulatory Visit: Payer: BC Managed Care – PPO | Admitting: Family Medicine

## 2021-09-13 ENCOUNTER — Encounter: Payer: Self-pay | Admitting: Family Medicine

## 2021-09-13 ENCOUNTER — Ambulatory Visit (INDEPENDENT_AMBULATORY_CARE_PROVIDER_SITE_OTHER): Payer: BC Managed Care – PPO | Admitting: Family Medicine

## 2021-09-13 ENCOUNTER — Other Ambulatory Visit: Payer: Self-pay

## 2021-09-13 VITALS — BP 113/71 | HR 73 | Temp 98.3°F | Resp 16 | Ht 70.0 in | Wt 210.0 lb

## 2021-09-13 DIAGNOSIS — Z125 Encounter for screening for malignant neoplasm of prostate: Secondary | ICD-10-CM

## 2021-09-13 DIAGNOSIS — Z0000 Encounter for general adult medical examination without abnormal findings: Secondary | ICD-10-CM

## 2021-09-13 DIAGNOSIS — Z1211 Encounter for screening for malignant neoplasm of colon: Secondary | ICD-10-CM

## 2021-09-13 DIAGNOSIS — E782 Mixed hyperlipidemia: Secondary | ICD-10-CM | POA: Diagnosis not present

## 2021-09-13 DIAGNOSIS — I10 Essential (primary) hypertension: Secondary | ICD-10-CM | POA: Diagnosis not present

## 2021-09-13 DIAGNOSIS — E6609 Other obesity due to excess calories: Secondary | ICD-10-CM

## 2021-09-13 DIAGNOSIS — Z6831 Body mass index (BMI) 31.0-31.9, adult: Secondary | ICD-10-CM

## 2021-09-13 DIAGNOSIS — N529 Male erectile dysfunction, unspecified: Secondary | ICD-10-CM

## 2021-09-13 MED ORDER — TADALAFIL 20 MG PO TABS: 20.0000 mg | ORAL_TABLET | Freq: Every day | ORAL | 5 refills | Status: AC | PRN

## 2021-09-13 NOTE — Progress Notes (Signed)
Complete physical exam  I,April Miller,acting as a scribe for Wilhemena Durie, MD.,have documented all relevant documentation on the behalf of Wilhemena Durie, MD,as directed by  Wilhemena Durie, MD while in the presence of Wilhemena Durie, MD.   Patient: Mason Henderson   DOB: 05-23-1966   56 y.o. Male  MRN: [id number] Visit Date: 09/13/2021  Today's healthcare provider: Wilhemena Durie, MD   Chief Complaint  Patient presents with   Annual Exam   Subjective    Mason Henderson is a 56 y.o. male who presents today for a complete physical exam.  He reports consuming a general diet. Home exercise routine includes walking. He generally feels well. He reports sleeping well. He does not have additional problems to discuss today.  He is feeling better from his recent abdominal pain event.  He is urinating fine and was unable to tolerate the tamsulosin Snores some but denies any apneic spells. He does have some ED he would like to have addressed.  He has trouble at times obtaining an erection and at times maintaining an erection.  No other issues present. HPI    Past Medical History:  Diagnosis Date   Fracture    left ankle April 2017   Past Surgical History:  Procedure Laterality Date   ANAL FISSURE REPAIR     Social History   Socioeconomic History   Marital status: Single    Spouse name: Not on file   Number of children: Not on file   Years of education: Not on file   Highest education level: Not on file  Occupational History   Not on file  Tobacco Use   Smoking status: Never   Smokeless tobacco: Never  Substance and Sexual Activity   Alcohol use: Yes    Comment: one drink daily   Drug use: No   Sexual activity: Not on file  Other Topics Concern   Not on file  Social History Narrative   Not on file   Social Determinants of Health   Financial Resource Strain: Not on file  Food Insecurity: Not on file  Transportation Needs: Not on file   Physical Activity: Not on file  Stress: Not on file  Social Connections: Not on file  Intimate Partner Violence: Not on file   Family Status  Relation Name Status   Mother  Deceased at age 52   Father  Alive   Sister  Alive   Son  Alive   PGF  (Not Specified)   Family History  Problem Relation Age of Onset   Breast cancer Mother    Esophageal cancer Mother    Lung cancer Father    Hypertension Father    Diverticulitis Father    Colon polyps Father    Hypertension Sister    Heart attack Paternal Grandfather    CAD Paternal Grandfather    No Known Allergies  Patient Care Team: Jerrol Banana., MD as PCP - General (Family Medicine)   Medications: Outpatient Medications Prior to Visit  Medication Sig   amLODipine-olmesartan (AZOR) 10-40 MG tablet TAKE 1 TABLET BY MOUTH EVERY DAY   hydrochlorothiazide (HYDRODIURIL) 25 MG tablet TAKE 1 TABLET(25 MG) BY MOUTH DAILY   hydrocortisone (ANUSOL-HC) 25 MG suppository Place 1 suppository (25 mg total) rectally at bedtime as needed for hemorrhoids or itching.   ketoconazole (NIZORAL) 2 % cream Apply 1 application topically daily.   meloxicam (MOBIC) 7.5 MG tablet Take 1 tablet (  7.5 mg total) by mouth daily. May repeat dose, max of 15 mg per day. Best taken with food to avoid GI upset.   pravastatin (PRAVACHOL) 20 MG tablet TAKE 1 TABLET(20 MG) BY MOUTH DAILY   tamsulosin (FLOMAX) 0.4 MG CAPS capsule Take 1 capsule (0.4 mg total) by mouth daily. (Patient not taking: Reported on 09/13/2021)   No facility-administered medications prior to visit.    Review of Systems  Constitutional:  Positive for activity change and appetite change.  All other systems reviewed and are negative.     Objective    BP 113/71 (BP Location: Left Arm, Patient Position: Sitting, Cuff Size: Large)    Pulse 73    Temp 98.3 F (36.8 C) (Temporal)    Resp 16    Ht 5\' 10"  (1.778 m)    Wt 210 lb (95.3 kg)    SpO2 96%    BMI 30.13 kg/m  BP Readings from  Last 3 Encounters:  09/13/21 113/71  08/18/21 119/80  09/10/20 120/73   Wt Readings from Last 3 Encounters:  09/13/21 210 lb (95.3 kg)  08/18/21 212 lb 1.6 oz (96.2 kg)  09/10/20 219 lb (99.3 kg)      Physical Exam Vitals reviewed.  Constitutional:      Appearance: He is well-developed.  HENT:     Head: Normocephalic and atraumatic.     Right Ear: External ear normal.     Left Ear: External ear normal.     Nose: Nose normal.  Eyes:     Conjunctiva/sclera: Conjunctivae normal.     Pupils: Pupils are equal, round, and reactive to light.  Cardiovascular:     Rate and Rhythm: Normal rate and regular rhythm.     Heart sounds: Normal heart sounds.  Pulmonary:     Effort: Pulmonary effort is normal.     Breath sounds: Normal breath sounds.  Abdominal:     General: Bowel sounds are normal.     Palpations: Abdomen is soft.     Comments: Very small, nontender umbilical hernia  Genitourinary:    Penis: Normal.      Testes: Normal.  Musculoskeletal:        General: Normal range of motion.     Cervical back: Normal range of motion and neck supple.  Skin:    General: Skin is warm and dry.  Neurological:     Mental Status: He is alert and oriented to person, place, and time.  Psychiatric:        Mood and Affect: Mood normal.        Behavior: Behavior normal.        Thought Content: Thought content normal.        Judgment: Judgment normal.      Results of the Epworth flowsheet 09/13/2021  Sitting and reading 0  Watching TV 0  Sitting, inactive in a public place (e.g. a theatre or a meeting) 0  As a passenger in a car for an hour without a break 1  Lying down to rest in the afternoon when circumstances permit 2  Sitting and talking to someone 0  Sitting quietly after a lunch without alcohol 0  In a car, while stopped for a few minutes in traffic 1  Total score 4    Last depression screening scores PHQ 2/9 Scores 09/13/2021 09/10/2020 07/24/2019  PHQ - 2 Score 0 0 0  PHQ- 9  Score 0 0 0   Last fall risk screening Fall Risk  09/13/2021  Falls in the past year? 0  Comment -  Number falls in past yr: 0  Injury with Fall? 0  Risk for fall due to : No Fall Risks  Follow up Falls evaluation completed   Last Audit-C alcohol use screening Alcohol Use Disorder Test (AUDIT) 09/13/2021  1. How often do you have a drink containing alcohol? 4  2. How many drinks containing alcohol do you have on a typical day when you are drinking? 2  3. How often do you have six or more drinks on one occasion? 2  AUDIT-C Score 8  4. How often during the last year have you found that you were not able to stop drinking once you had started? -  5. How often during the last year have you failed to do what was normally expected from you because of drinking? -  6. How often during the last year have you needed a first drink in the morning to get yourself going after a heavy drinking session? -  7. How often during the last year have you had a feeling of guilt of remorse after drinking? -  8. How often during the last year have you been unable to remember what happened the night before because you had been drinking? -  9. Have you or someone else been injured as a result of your drinking? -  10. Has a relative or friend or a doctor or another health worker been concerned about your drinking or suggested you cut down? -  Alcohol Use Disorder Identification Test Final Score (AUDIT) -   A score of 3 or more in women, and 4 or more in men indicates increased risk for alcohol abuse, EXCEPT if all of the points are from question 1   No results found for any visits on 09/13/21.  Assessment & Plan    Routine Health Maintenance and Physical Exam  Exercise Activities and Dietary recommendations  Goals   None     Immunization History  Administered Date(s) Administered   Influenza,inj,Quad PF,6+ Mos 05/29/2014   Influenza-Unspecified 06/09/2015, 05/21/2017, 04/08/2018, 06/06/2021   PFIZER(Purple  Top)SARS-COV-2 Vaccination 01/10/2020, 01/31/2020   Td 07/24/2019   Tdap 11/19/2008, 06/03/2009    Health Maintenance  Topic Date Due   HIV Screening  Never done   Hepatitis C Screening  Never done   Zoster Vaccines- Shingrix (1 of 2) Never done   COVID-19 Vaccine (3 - Booster for Pfizer series) 03/27/2020   COLONOSCOPY (Pts 45-20yrs Insurance coverage will need to be confirmed)  07/21/2027   TETANUS/TDAP  07/23/2029   INFLUENZA VACCINE  Completed   HPV VACCINES  Aged Out    Discussed health benefits of physical activity, and encouraged him to engage in regular exercise appropriate for his age and condition.  1. Annual physical exam  - Lipid panel - CBC w/Diff/Platelet - Comprehensive Metabolic Panel (CMET)  2. Essential (primary) hypertension  - Lipid panel - CBC w/Diff/Platelet - Comprehensive Metabolic Panel (CMET)  3. Mixed hyperlipidemia  - Lipid panel - CBC w/Diff/Platelet - Comprehensive Metabolic Panel (CMET)  4. Class 1 obesity due to excess calories with serious comorbidity and body mass index (BMI) of 31.0 to 31.9 in adult Diet and exercise discussed and stressed - Lipid panel - CBC w/Diff/Platelet - Comprehensive Metabolic Panel (CMET)  5. Prostate cancer screening  - PSA  6. Screening for colon cancer Time for follow-up colonoscopy 10 years later - Ambulatory referral to Gastroenterology  7. Erectile dysfunction, unspecified erectile dysfunction type  Discussed and patient wants to try tadalafil.  Risk and benefits discussed. - tadalafil (CIALIS) 20 MG tablet; Take 1 tablet (20 mg total) by mouth daily as needed for erectile dysfunction. Prior to intercourse  Dispense: 20 tablet; Refill: 5   No follow-ups on file.     I, Wilhemena Durie, MD, have reviewed all documentation for this visit. The documentation on 09/13/21 for the exam, diagnosis, procedures, and orders are all accurate and complete.    Micala Saltsman Cranford Mon, MD  [facility] [phone] (phone) [phone] (fax)  [place]

## 2021-09-14 ENCOUNTER — Telehealth: Payer: Self-pay

## 2021-09-14 NOTE — Telephone Encounter (Signed)
CALLED PATIENT NO ANSWER LEFT VOICEMAIL FOR A CALL BACK ? ?

## 2021-09-17 ENCOUNTER — Telehealth: Payer: Self-pay | Admitting: Family Medicine

## 2021-09-17 NOTE — Telephone Encounter (Signed)
Contacted patient back to get clarification In regards to pharmacy he would like Rx sent to. Patient states that he picked up prescription that was sent in on 09/13/21 to [facility] in [place]. Patient states that even with the Good RX card he still had to pay out of pocket $300.00. Patient wanted to know from Dr. Rosanna Randy when he needs refill if it could be sent to a different pharmacy such as [facility] or [facility]? Patient states that Dr. Rosanna Randy has mentioned these pharmacies that price would be cheaper. Patient states that if it would still be $300 or more he would prefer being prescribed a alternative drug that would be cheaper. I advised patient that Dr.Gilbert is out of office until Monday 09/20/21, patient states that he can wait to hear back from Dr. Rosanna Randy when he returns. KW

## 2021-09-17 NOTE — Telephone Encounter (Signed)
Please advise 

## 2021-09-17 NOTE — Telephone Encounter (Signed)
Pt stated the price for medication adalafil (CIALIS) 20 MG tablet is $1,000.00 stated with good RX card it brought medication down to $350.00. Pt stated he is not paying that, asking if it can be sent elsewhere or for an alternative.  Pt requesting a call back.

## 2021-09-22 ENCOUNTER — Other Ambulatory Visit: Payer: Self-pay

## 2021-09-22 DIAGNOSIS — Z1211 Encounter for screening for malignant neoplasm of colon: Secondary | ICD-10-CM

## 2021-09-22 MED ORDER — NA SULFATE-K SULFATE-MG SULF 17.5-3.13-1.6 GM/177ML PO SOLN: 1.0000 | Freq: Once | ORAL | 0 refills | Status: AC

## 2021-09-22 NOTE — Progress Notes (Signed)
Gastroenterology Pre-Procedure Review  Request Date: 10/22/2021 Requesting Physician: Dr. Marius Ditch  PATIENT REVIEW QUESTIONS: The patient responded to the following health history questions as indicated:    1. Are you having any GI issues? no 2. Do you have a personal history of Polyps? no 3. Do you have a family history of Colon Cancer or Polyps? YES DAD HAD POLYPS 4. Diabetes Mellitus? no 5. Joint replacements in the past 12 months?no 6. Major health problems in the past 3 months?no 7. Any artificial heart valves, MVP, or defibrillator?no    MEDICATIONS & ALLERGIES:    Patient reports the following regarding taking any anticoagulation/antiplatelet therapy:   Plavix, Coumadin, Eliquis, Xarelto, Lovenox, Pradaxa, Brilinta, or Effient? no Aspirin? no  Patient confirms/reports the following medications:  Current Outpatient Medications  Medication Sig Dispense Refill   amLODipine-olmesartan (AZOR) 10-40 MG tablet TAKE 1 TABLET BY MOUTH EVERY DAY 90 tablet 3   hydrochlorothiazide (HYDRODIURIL) 25 MG tablet TAKE 1 TABLET(25 MG) BY MOUTH DAILY 90 tablet 3   hydrocortisone (ANUSOL-HC) 25 MG suppository Place 1 suppository (25 mg total) rectally at bedtime as needed for hemorrhoids or itching. 12 suppository 1   ketoconazole (NIZORAL) 2 % cream Apply 1 application topically daily. 15 g 0   meloxicam (MOBIC) 7.5 MG tablet Take 1 tablet (7.5 mg total) by mouth daily. May repeat dose, max of 15 mg per day. Best taken with food to avoid GI upset. 60 tablet 0   pravastatin (PRAVACHOL) 20 MG tablet TAKE 1 TABLET(20 MG) BY MOUTH DAILY 90 tablet 3   tadalafil (CIALIS) 20 MG tablet Take 1 tablet (20 mg total) by mouth daily as needed for erectile dysfunction. Prior to intercourse 20 tablet 5   tamsulosin (FLOMAX) 0.4 MG CAPS capsule Take 1 capsule (0.4 mg total) by mouth daily. 30 capsule 3   No current facility-administered medications for this visit.    Patient confirms/reports the following  allergies:  No Known Allergies  No orders of the defined types were placed in this encounter.   AUTHORIZATION INFORMATION Primary Insurance: 1D#: Group #:  Secondary Insurance: 1D#: Group #:  SCHEDULE INFORMATION: Date: 10/22/2021 Time: Location:[facility]

## 2021-10-15 LAB — CBC WITH DIFFERENTIAL/PLATELET
Basophils Absolute: 0.1 10*3/uL (ref 0.0–0.2)
Basos: 1 %
EOS (ABSOLUTE): 0.2 10*3/uL (ref 0.0–0.4)
Eos: 4 %
Hematocrit: 45.6 % (ref 37.5–51.0)
Hemoglobin: 15.7 g/dL (ref 13.0–17.7)
Immature Grans (Abs): 0 10*3/uL (ref 0.0–0.1)
Immature Granulocytes: 0 %
Lymphocytes Absolute: 1.2 10*3/uL (ref 0.7–3.1)
Lymphs: 30 %
MCH: 32 pg (ref 26.6–33.0)
MCHC: 34.4 g/dL (ref 31.5–35.7)
MCV: 93 fL (ref 79–97)
Monocytes Absolute: 0.4 10*3/uL (ref 0.1–0.9)
Monocytes: 10 %
Neutrophils Absolute: 2.2 10*3/uL (ref 1.4–7.0)
Neutrophils: 55 %
Platelets: 288 10*3/uL (ref 150–450)
RBC: 4.9 x10E6/uL (ref 4.14–5.80)
RDW: 12.9 % (ref 11.6–15.4)
WBC: 4 10*3/uL (ref 3.4–10.8)

## 2021-10-15 LAB — LIPID PANEL
Chol/HDL Ratio: 3 ratio (ref 0.0–5.0)
Cholesterol, Total: 232 mg/dL — ABNORMAL HIGH (ref 100–199)
HDL: 77 mg/dL (ref >39)
LDL Chol Calc (NIH): 131 mg/dL — ABNORMAL HIGH (ref 0–99)
Triglycerides: 139 mg/dL (ref 0–149)
VLDL Cholesterol Cal: 24 mg/dL (ref 5–40)

## 2021-10-15 LAB — COMPREHENSIVE METABOLIC PANEL
ALT: 40 IU/L (ref 0–44)
AST: 32 IU/L (ref 0–40)
Albumin/Globulin Ratio: 2.4 — ABNORMAL HIGH (ref 1.2–2.2)
Albumin: 5.2 g/dL — ABNORMAL HIGH (ref 3.8–4.9)
Alkaline Phosphatase: 51 IU/L (ref 44–121)
BUN/Creatinine Ratio: 13 (ref 9–20)
BUN: 17 mg/dL (ref 6–24)
Bilirubin Total: 0.7 mg/dL (ref 0.0–1.2)
CO2: 24 mmol/L (ref 20–29)
Calcium: 10.8 mg/dL — ABNORMAL HIGH (ref 8.7–10.2)
Chloride: 100 mmol/L (ref 96–106)
Creatinine, Ser: 1.3 mg/dL — ABNORMAL HIGH (ref 0.76–1.27)
Globulin, Total: 2.2 g/dL (ref 1.5–4.5)
Glucose: 101 mg/dL — ABNORMAL HIGH (ref 70–99)
Potassium: 5.3 mmol/L — ABNORMAL HIGH (ref 3.5–5.2)
Sodium: 138 mmol/L (ref 134–144)
Total Protein: 7.4 g/dL (ref 6.0–8.5)
eGFR: 65 mL/min/{1.73_m2} (ref >59)

## 2021-10-15 LAB — PSA: Prostate Specific Ag, Serum: 0.7 ng/mL (ref 0.0–4.0)

## 2021-10-21 ENCOUNTER — Encounter: Payer: Self-pay | Admitting: Gastroenterology

## 2021-10-22 ENCOUNTER — Ambulatory Visit: Payer: BC Managed Care – PPO | Admitting: Anesthesiology

## 2021-10-22 ENCOUNTER — Other Ambulatory Visit: Payer: Self-pay

## 2021-10-22 ENCOUNTER — Encounter
Admission: RE | Disposition: A | Discharge status: Home / Self Care | Payer: Self-pay | Source: Home / Self Care | Attending: Gastroenterology

## 2021-10-22 ENCOUNTER — Ambulatory Visit
Admission: RE | Admit: 2021-10-22 | Discharge: 2021-10-22 | Disposition: A | Discharge status: Home / Self Care | Payer: BC Managed Care – PPO | Attending: Gastroenterology | Admitting: Gastroenterology

## 2021-10-22 ENCOUNTER — Encounter: Payer: Self-pay | Admitting: Gastroenterology

## 2021-10-22 DIAGNOSIS — Z8371 Family history of colonic polyps: Secondary | ICD-10-CM | POA: Diagnosis not present

## 2021-10-22 DIAGNOSIS — I10 Essential (primary) hypertension: Secondary | ICD-10-CM | POA: Insufficient documentation

## 2021-10-22 DIAGNOSIS — Z1211 Encounter for screening for malignant neoplasm of colon: Secondary | ICD-10-CM | POA: Diagnosis not present

## 2021-10-22 DIAGNOSIS — D125 Benign neoplasm of sigmoid colon: Secondary | ICD-10-CM | POA: Insufficient documentation

## 2021-10-22 DIAGNOSIS — K635 Polyp of colon: Secondary | ICD-10-CM | POA: Diagnosis not present

## 2021-10-22 DIAGNOSIS — Z79899 Other long term (current) drug therapy: Secondary | ICD-10-CM | POA: Insufficient documentation

## 2021-10-22 HISTORY — DX: Essential (primary) hypertension: I10

## 2021-10-22 HISTORY — PX: COLONOSCOPY WITH PROPOFOL: SHX5780

## 2021-10-22 SURGERY — COLONOSCOPY WITH PROPOFOL
Anesthesia: General

## 2021-10-22 MED ORDER — LIDOCAINE HCL (CARDIAC) PF 100 MG/5ML IV SOSY
PREFILLED_SYRINGE | INTRAVENOUS | Status: DC | PRN
  Administered 2021-10-22: 60 mg via INTRAVENOUS

## 2021-10-22 MED ORDER — PHENYLEPHRINE HCL (PRESSORS) 10 MG/ML IV SOLN
INTRAVENOUS | Status: DC | PRN
Start: 2021-10-22 — End: 2021-10-22
  Administered 2021-10-22: 80 ug via INTRAVENOUS

## 2021-10-22 MED ORDER — DEXMEDETOMIDINE (PRECEDEX) IN NS 20 MCG/5ML (4 MCG/ML) IV SYRINGE
PREFILLED_SYRINGE | INTRAVENOUS | Status: DC | PRN
Start: 2021-10-22 — End: 2021-10-22
  Administered 2021-10-22: 8 ug via INTRAVENOUS

## 2021-10-22 MED ORDER — PROPOFOL 500 MG/50ML IV EMUL
INTRAVENOUS | Status: DC | PRN
  Administered 2021-10-22: 140 ug/kg/min via INTRAVENOUS

## 2021-10-22 MED ORDER — SODIUM CHLORIDE 0.9 % IV SOLN: INTRAVENOUS | Status: DC

## 2021-10-22 MED ORDER — PROPOFOL 10 MG/ML IV BOLUS
INTRAVENOUS | Status: DC | PRN
Start: 2021-10-22 — End: 2021-10-22
  Administered 2021-10-22: 100 mg via INTRAVENOUS

## 2021-10-22 NOTE — H&P (Signed)
?Cephas Darby, MD ?[address]  ?[address]  ?[place], [place] [address]  ?Main: [phone]  ?Fax: [phone] ?Pager: [phone] ? ?Primary Care Physician:  Jerrol Banana., MD ?Primary Gastroenterologist:  Dr. Cephas Darby ? ?Pre-Procedure History & Physical: ?HPI:  Mason Henderson is a 56 y.o. male is here for an colonoscopy. ?  ?Past Medical History:  ?Diagnosis Date  ? Fracture   ? left ankle April 2017  ? Hypertension   ? ? ?Past Surgical History:  ?Procedure Laterality Date  ? ANAL FISSURE REPAIR    ? ? ?Prior to Admission medications   ?Medication Sig Start Date End Date Taking? Authorizing Provider  ?amLODipine-olmesartan (AZOR) 10-40 MG tablet TAKE 1 TABLET BY MOUTH EVERY DAY 11/05/20  Yes Jerrol Banana., MD  ?hydrochlorothiazide (HYDRODIURIL) 25 MG tablet TAKE 1 TABLET(25 MG) BY MOUTH DAILY 11/05/20  Yes Jerrol Banana., MD  ?hydrocortisone (ANUSOL-HC) 25 MG suppository Place 1 suppository (25 mg total) rectally at bedtime as needed for hemorrhoids or itching. 06/13/16  Yes Jerrol Banana., MD  ?pravastatin (PRAVACHOL) 20 MG tablet TAKE 1 TABLET(20 MG) BY MOUTH DAILY 02/11/19  Yes Jerrol Banana., MD  ?ketoconazole (NIZORAL) 2 % cream Apply 1 application topically daily. 07/11/17   Jerrol Banana., MD  ?meloxicam (MOBIC) 7.5 MG tablet Take 1 tablet (7.5 mg total) by mouth daily. May repeat dose, max of 15 mg per day. Best taken with food to avoid GI upset. ?Patient not taking: Reported on 10/22/2021 08/19/21   Gwyneth Sprout, FNP  ?tadalafil (CIALIS) 20 MG tablet Take 1 tablet (20 mg total) by mouth daily as needed for erectile dysfunction. Prior to intercourse 09/13/21   Jerrol Banana., MD  ?tamsulosin (FLOMAX) 0.4 MG CAPS capsule Take 1 capsule (0.4 mg total) by mouth daily. ?Patient not taking: Reported on 10/22/2021 08/19/21   Gwyneth Sprout, FNP  ? ? ?Allergies as of 09/22/2021  ? (No Known Allergies)  ? ? ?Family History  ?Problem Relation Age of  Onset  ? Breast cancer Mother   ? Esophageal cancer Mother   ? Lung cancer Father   ? Hypertension Father   ? Diverticulitis Father   ? Colon polyps Father   ? Hypertension Sister   ? Heart attack Paternal Grandfather   ? CAD Paternal Grandfather   ? ? ?Social History  ? ?Socioeconomic History  ? Marital status: Single  ?  Spouse name: Not on file  ? Number of children: Not on file  ? Years of education: Not on file  ? Highest education level: Not on file  ?Occupational History  ? Not on file  ?Tobacco Use  ? Smoking status: Never  ? Smokeless tobacco: Never  ?Vaping Use  ? Vaping Use: Never used  ?Substance and Sexual Activity  ? Alcohol use: Yes  ?  Comment: one drink daily  ? Drug use: No  ? Sexual activity: Not on file  ?Other Topics Concern  ? Not on file  ?Social History Narrative  ? Not on file  ? ?Social Determinants of Health  ? ?Financial Resource Strain: Not on file  ?Food Insecurity: Not on file  ?Transportation Needs: Not on file  ?Physical Activity: Not on file  ?Stress: Not on file  ?Social Connections: Not on file  ?Intimate Partner Violence: Not on file  ? ? ?Review of Systems: ?See HPI, otherwise negative ROS ? ?Physical Exam: ?BP 116/83  Pulse 70   Temp (!) 97.2 ?F (36.2 ?C) (Temporal)   Resp 20   Ht '5\' 11"'$  (1.803 m)   Wt 96.6 kg   SpO2 98%   BMI 29.71 kg/m?  ?General:   Alert,  pleasant and cooperative in NAD ?Head:  Normocephalic and atraumatic. ?Neck:  Supple; no masses or thyromegaly. ?Lungs:  Clear throughout to auscultation.    ?Heart:  Regular rate and rhythm. ?Abdomen:  Soft, nontender and nondistended. Normal bowel sounds, without guarding, and without rebound.   ?Neurologic:  Alert and  oriented x4;  grossly normal neurologically. ? ?Impression/Plan: ?Mason Henderson is here for an colonoscopy to be performed for colon cancer screening ? ?Risks, benefits, limitations, and alternatives regarding  colonoscopy have been reviewed with the patient.  Questions have been answered.  All  parties agreeable. ? ? ?Sherri Sear, MD  10/22/2021, 9:17 AM ?

## 2021-10-22 NOTE — Transfer of Care (Signed)
Immediate Anesthesia Transfer of Care Note ? ?Patient: Mason Henderson ? ?Procedure(s) Performed: COLONOSCOPY WITH PROPOFOL ? ?Patient Location: PACU ? ?Anesthesia Type:General ? ?Level of Consciousness: awake, alert  and oriented ? ?Airway & Oxygen Therapy: Patient Spontanous Breathing ? ?Post-op Assessment: Report given to RN and Post -op Vital signs reviewed and stable ? ?Post vital signs: Reviewed and stable ? ?Last Vitals:  ?Vitals Value Taken Time  ?BP 86/52 10/22/21 1007  ?Temp 35.9 ?C 10/22/21 1007  ?Pulse 67 10/22/21 1011  ?Resp 15 10/22/21 1011  ?SpO2 96 % 10/22/21 1011  ?Vitals shown include unvalidated device data. ? ?Last Pain:  ?Vitals:  ? 10/22/21 1007  ?TempSrc: Temporal  ?PainSc: (P) 0-No pain  ?   ? ?  ? ?Complications: No notable events documented. ?

## 2021-10-22 NOTE — Anesthesia Preprocedure Evaluation (Addendum)
Anesthesia Evaluation  ?Patient identified by MRN, date of birth, ID band ?Patient awake ? ? ? ?Reviewed: ?Allergy & Precautions, NPO status , Patient's Chart, lab work & pertinent test results ? ?Airway ?Mallampati: II ? ?TM Distance: >3 FB ?Neck ROM: full ? ? ? Dental ?no notable dental hx. ? ?  ?Pulmonary ?neg pulmonary ROS,  ?  ?Pulmonary exam normal ? ? ? ? ? ? ? Cardiovascular ?hypertension, Pt. on medications ?Normal cardiovascular exam ? ? ?  ?Neuro/Psych ?negative neurological ROS ? negative psych ROS  ? GI/Hepatic ?negative GI ROS, Neg liver ROS,   ?Endo/Other  ?negative endocrine ROS ? Renal/GU ?negative Renal ROS  ?negative genitourinary ?  ?Musculoskeletal ? ? Abdominal ?Normal abdominal exam  (+)   ?Peds ? Hematology ?negative hematology ROS ?(+)   ?Anesthesia Other Findings ?Past Medical History: ?No date: Fracture ?    Comment:  left ankle April 2017 ? ?Past Surgical History: ?No date: ANAL FISSURE REPAIR ? ? ? ? Reproductive/Obstetrics ?negative OB ROS ? ?  ? ? ? ? ? ? ? ? ? ? ? ? ? ?  ?  ? ? ? ? ? ? ? ?Anesthesia Physical ?Anesthesia Plan ? ?ASA: 2 ? ?Anesthesia Plan: General  ? ?Post-op Pain Management:   ? ?Induction: Intravenous ? ?PONV Risk Score and Plan: Propofol infusion and TIVA ? ?Airway Management Planned: Natural Airway and Simple Face Mask ? ?Additional Equipment:  ? ?Intra-op Plan:  ? ?Post-operative Plan:  ? ?Informed Consent: I have reviewed the patients History and Physical, chart, labs and discussed the procedure including the risks, benefits and alternatives for the proposed anesthesia with the patient or authorized representative who has indicated his/her understanding and acceptance.  ? ? ? ?Dental advisory given ? ?Plan Discussed with: Anesthesiologist, CRNA and Surgeon ? ?Anesthesia Plan Comments:   ? ? ? ? ? ?Anesthesia Quick Evaluation ? ?

## 2021-10-22 NOTE — Anesthesia Postprocedure Evaluation (Signed)
Anesthesia Post Note ? ?Patient: ZYON ROSSER ? ?Procedure(s) Performed: COLONOSCOPY WITH PROPOFOL ? ?Patient location during evaluation: Endoscopy ?Anesthesia Type: General ?Level of consciousness: awake and alert ?Pain management: pain level controlled ?Vital Signs Assessment: post-procedure vital signs reviewed and stable ?Respiratory status: spontaneous breathing, nonlabored ventilation and respiratory function stable ?Cardiovascular status: blood pressure returned to baseline and stable ?Postop Assessment: no apparent nausea or vomiting ?Anesthetic complications: no ? ? ?No notable events documented. ? ? ?Last Vitals:  ?Vitals:  ? 10/22/21 1017 10/22/21 1027  ?BP: (!) 89/58   ?Pulse: 62 64  ?Resp: 15 16  ?Temp:    ?SpO2: 96% 97%  ?  ?Last Pain:  ?Vitals:  ? 10/22/21 1017  ?TempSrc:   ?PainSc: 0-No pain  ? ? ?  ?  ?  ?  ?  ?  ? ?Iran Ouch ? ? ? ? ?

## 2021-10-22 NOTE — Op Note (Signed)
[facility] ?Gastroenterology ?Patient Name: Mason Henderson ?Procedure Date: 10/22/2021 9:47 AM ?MRN: [id number] ?Account #: [id number] ?Date of Birth: [birth year] ?Admit Type: Outpatient ?Age: 56 ?Room: [facility] ENDO ROOM 2 ?Gender: Male ?Note Status: Finalized ?Instrument Name: Colonoscope [id number] ?Procedure:             Colonoscopy ?Indications:           Screening for colorectal malignant neoplasm, Last  ?                       colonoscopy 10 years ago ?Providers:             Lin Landsman MD, MD ?Referring MD:          Janine Ores. Rosanna Randy, MD (Referring MD) ?Medicines:             General Anesthesia ?Complications:         No immediate complications. Estimated blood loss: None. ?Procedure:             Pre-Anesthesia Assessment: ?                       - Prior to the procedure, a History and Physical was  ?                       performed, and patient medications and allergies were  ?                       reviewed. The patient is competent. The risks and  ?                       benefits of the procedure and the sedation options and  ?                       risks were discussed with the patient. All questions  ?                       were answered and informed consent was obtained.  ?                       Patient identification and proposed procedure were  ?                       verified by the physician, the nurse, the  ?                       anesthesiologist, the anesthetist and the technician  ?                       in the pre-procedure area in the procedure room in the  ?                       endoscopy suite. Mental Status Examination: alert and  ?                       oriented. Airway Examination: normal oropharyngeal  ?                       airway and neck mobility. Respiratory Examination:  ?  clear to auscultation. CV Examination: normal.  ?                       Prophylactic Antibiotics: The patient does not require  ?                       prophylactic  antibiotics. Prior Anticoagulants: The  ?                       patient has taken no previous anticoagulant or  ?                       antiplatelet agents. ASA Grade Assessment: II - A  ?                       patient with mild systemic disease. After reviewing  ?                       the risks and benefits, the patient was deemed in  ?                       satisfactory condition to undergo the procedure. The  ?                       anesthesia plan was to use general anesthesia.  ?                       Immediately prior to administration of medications,  ?                       the patient was re-assessed for adequacy to receive  ?                       sedatives. The heart rate, respiratory rate, oxygen  ?                       saturations, blood pressure, adequacy of pulmonary  ?                       ventilation, and response to care were monitored  ?                       throughout the procedure. The physical status of the  ?                       patient was re-assessed after the procedure. ?                       After obtaining informed consent, the colonoscope was  ?                       passed under direct vision. Throughout the procedure,  ?                       the patient's blood pressure, pulse, and oxygen  ?                       saturations were monitored continuously. The  ?  Colonoscope was introduced through the anus and  ?                       advanced to the the terminal ileum, with  ?                       identification of the appendiceal orifice and IC  ?                       valve. The colonoscopy was performed without  ?                       difficulty. The patient tolerated the procedure well.  ?                       The quality of the bowel preparation was evaluated  ?                       using the BBPS [facility] Bowel Preparation Scale) with  ?                       scores of: Right Colon = 3, Transverse Colon = 3 and  ?                       Left Colon =  3 (entire mucosa seen well with no  ?                       residual staining, small fragments of stool or opaque  ?                       liquid). The total BBPS score equals 9. ?Findings: ?     The perianal and digital rectal examinations were normal. Pertinent  ?     negatives include normal sphincter tone and no palpable rectal lesions. ?     The terminal ileum appeared normal. ?     A 7 mm polyp was found in the sigmoid colon. The polyp was sessile. The  ?     polyp was removed with a cold snare. Resection and retrieval were  ?     complete. To prevent bleeding after the polypectomy, one hemostatic clip  ?     was successfully placed (MR conditional). There was no bleeding at the  ?     end of the procedure. Estimated blood loss: none. ?     The retroflexed view of the distal rectum and anal verge was normal and  ?     showed no anal or rectal abnormalities. ?Impression:            - The examined portion of the ileum was normal. ?                       - One 7 mm polyp in the sigmoid colon, removed with a  ?                       cold snare. Resected and retrieved. Clip (MR  ?                       conditional) was placed. ?                       -  The distal rectum and anal verge are normal on  ?                       retroflexion view. ?Recommendation:        - Discharge patient to home (with escort). ?                       - Resume previous diet today. ?                       - Continue present medications. ?                       - Await pathology results. ?                       - Repeat colonoscopy in 5 years for surveillance based  ?                       on pathology results. ?Procedure Code(s):     --- Professional --- ?                       [phone], Colonoscopy, flexible; with removal of  ?                       tumor(s), polyp(s), or other lesion(s) by snare  ?                       technique ?Diagnosis Code(s):     --- Professional --- ?                       Z12.11, Encounter for screening for  malignant neoplasm  ?                       of colon ?                       K63.5, Polyp of colon ?CPT copyright 2019 American Medical Association. All rights reserved. ?The codes documented in this report are preliminary and upon coder review may  ?be revised to meet current compliance requirements. ?Dr. Ulyess Mort ?Haruka Kowaleski Raeanne Gathers MD, MD ?10/22/2021 10:07:03 AM ?This report has been signed electronically. ?Number of Addenda: 0 ?Note Initiated On: 10/22/2021 9:47 AM ?Scope Withdrawal Time: 0 hours 10 minutes 4 seconds  ?Total Procedure Duration: 0 hours 11 minutes 8 seconds  ?Estimated Blood Loss:  Estimated blood loss: none. ?     [facility] ?

## 2021-10-25 ENCOUNTER — Encounter: Payer: Self-pay | Admitting: Gastroenterology

## 2021-10-25 LAB — SURGICAL PATHOLOGY

## 2021-10-31 ENCOUNTER — Other Ambulatory Visit: Payer: Self-pay | Admitting: Family Medicine

## 2022-01-24 NOTE — Progress Notes (Unsigned)
    Established patient visit  I,April Miller,acting as a scribe for Richard Gilbert Jr, MD.,have documented all relevant documentation on the behalf of Richard Gilbert Jr, MD,as directed by  Richard Gilbert Jr, MD while in the presence of Richard Gilbert Jr, MD.   Patient: Mason Henderson   DOB: 06/10/1966   56 y.o. Male  MRN: [id number] Visit Date: 01/25/2022  Today's healthcare provider: Richard Gilbert Jr, MD   Chief Complaint  Patient presents with   Follow-up   Hypertension   Subjective    HPI  Hypertension, follow-up  BP Readings from Last 3 Encounters:  01/25/22 128/76  10/22/21 (!) 89/58  09/13/21 113/71   Wt Readings from Last 3 Encounters:  01/25/22 211 lb (95.7 kg)  10/22/21 213 lb (96.6 kg)  09/13/21 210 lb (95.3 kg)     He was last seen for hypertension 4 months ago.  Management since that visit includes; Labs checked showing-stable. Advised to stop HCTZ for now.  Follow-up in 2 to 3 months and we will repeat calcium then.  Stopping HCTZ because of the high calcium which may get back to normal.  Please advise patient..  Outside blood pressures are 139/82.  Pertinent labs Lab Results  Component Value Date   CHOL 232 (H) 10/14/2021   HDL 77 10/14/2021   LDLCALC 131 (H) 10/14/2021   TRIG 139 10/14/2021   CHOLHDL 3.0 10/14/2021   Lab Results  Component Value Date   NA 138 10/14/2021   K 5.3 (H) 10/14/2021   CREATININE 1.30 (H) 10/14/2021   EGFR 65 10/14/2021   GLUCOSE 101 (H) 10/14/2021   TSH 1.640 09/29/2020     The 10-year ASCVD risk score (Arnett DK, et al., 2019) is: 5.8%  ---------------------------------------------------------------------------------------------------   Medications: Outpatient Medications Prior to Visit  Medication Sig   amLODipine-olmesartan (AZOR) 10-40 MG tablet TAKE 1 TABLET BY MOUTH EVERY DAY   hydrocortisone (ANUSOL-HC) 25 MG suppository Place 1 suppository (25 mg total) rectally at bedtime as needed for  hemorrhoids or itching.   ketoconazole (NIZORAL) 2 % cream Apply 1 application topically daily.   pravastatin (PRAVACHOL) 20 MG tablet TAKE 1 TABLET(20 MG) BY MOUTH DAILY   tadalafil (CIALIS) 20 MG tablet Take 1 tablet (20 mg total) by mouth daily as needed for erectile dysfunction. Prior to intercourse   hydrochlorothiazide (HYDRODIURIL) 25 MG tablet TAKE 1 TABLET(25 MG) BY MOUTH DAILY (Patient not taking: Reported on 01/25/2022)   [DISCONTINUED] meloxicam (MOBIC) 7.5 MG tablet Take 1 tablet (7.5 mg total) by mouth daily. May repeat dose, max of 15 mg per day. Best taken with food to avoid GI upset. (Patient not taking: Reported on 10/22/2021)   [DISCONTINUED] tamsulosin (FLOMAX) 0.4 MG CAPS capsule Take 1 capsule (0.4 mg total) by mouth daily. (Patient not taking: Reported on 10/22/2021)   No facility-administered medications prior to visit.    Review of Systems  Constitutional:  Negative for appetite change, chills and fever.  Respiratory:  Negative for chest tightness, shortness of breath and wheezing.   Cardiovascular:  Negative for chest pain and palpitations.  Gastrointestinal:  Negative for abdominal pain, nausea and vomiting.    {Labs  Heme  Chem  Endocrine  Serology  Results Review (optional):23779}   Objective    BP 128/76 (BP Location: Right Arm, Patient Position: Sitting, Cuff Size: Large)   Pulse 67   Resp 16   Wt 211 lb (95.7 kg)   SpO2 96%   BMI 29.43 kg/m  {Show   previous vital signs (optional):23777}  Physical Exam  ***  No results found for any visits on 01/25/22.  Assessment & Plan     ***  No follow-ups on file.      {provider attestation***:1}   Richard Gilbert Jr, MD  [facility] [phone] (phone) [phone] (fax)  [facility] 

## 2022-01-25 ENCOUNTER — Ambulatory Visit: Payer: BC Managed Care – PPO | Admitting: Family Medicine

## 2022-01-25 ENCOUNTER — Encounter: Payer: Self-pay | Admitting: Family Medicine

## 2022-01-25 DIAGNOSIS — E782 Mixed hyperlipidemia: Secondary | ICD-10-CM

## 2022-01-25 DIAGNOSIS — Z6831 Body mass index (BMI) 31.0-31.9, adult: Secondary | ICD-10-CM

## 2022-01-25 DIAGNOSIS — I10 Essential (primary) hypertension: Secondary | ICD-10-CM

## 2022-01-25 DIAGNOSIS — K760 Fatty (change of) liver, not elsewhere classified: Secondary | ICD-10-CM

## 2022-01-25 DIAGNOSIS — E6609 Other obesity due to excess calories: Secondary | ICD-10-CM

## 2022-02-12 LAB — PTH, INTACT AND CALCIUM
Calcium: 10.2 mg/dL (ref 8.7–10.2)
PTH: 22 pg/mL (ref 15–65)

## 2022-03-14 ENCOUNTER — Ambulatory Visit: Payer: BC Managed Care – PPO | Admitting: Family Medicine

## 2022-07-06 ENCOUNTER — Ambulatory Visit: Payer: BC Managed Care – PPO | Admitting: Dermatology

## 2022-08-04 ENCOUNTER — Ambulatory Visit: Payer: BC Managed Care – PPO | Admitting: Family Medicine

## 2022-09-14 ENCOUNTER — Encounter: Payer: BC Managed Care – PPO | Admitting: Family Medicine

## 2023-12-25 IMAGING — CT CT ABD-PELV W/O CM
1 of 2 series · 15 of 32 positions shown, 19 images · non-contrast
Comparison: None.

CLINICAL DATA: Acute lower abdominal pain.



[Series 2: axial st · axial · 0.94mm/px · z∈[-1080,-570]mm · 15 of 112 slices shown, 19 images]
[im 5/112  soft-tissue]
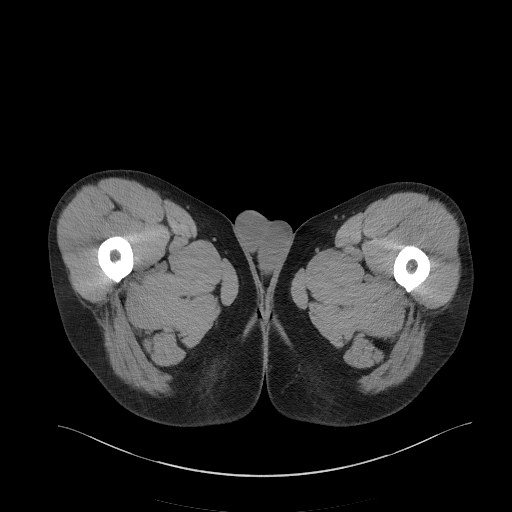
[im 5/112  bone]
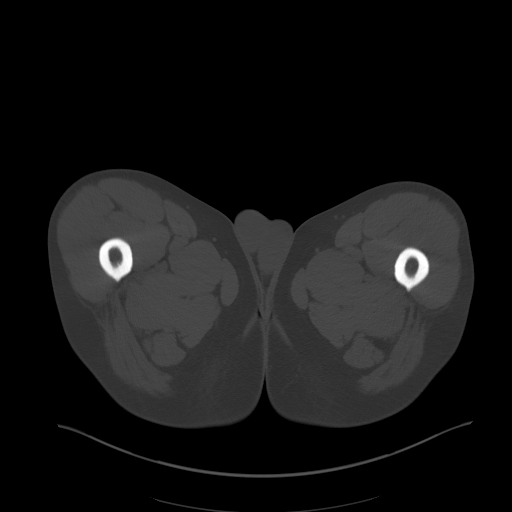
[im 15/112  soft-tissue]
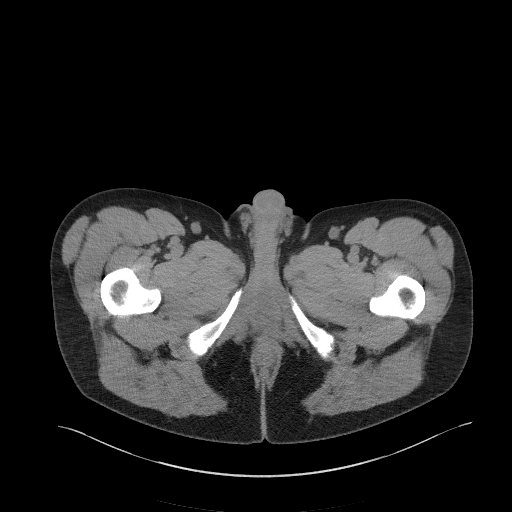
[im 25/112  soft-tissue]
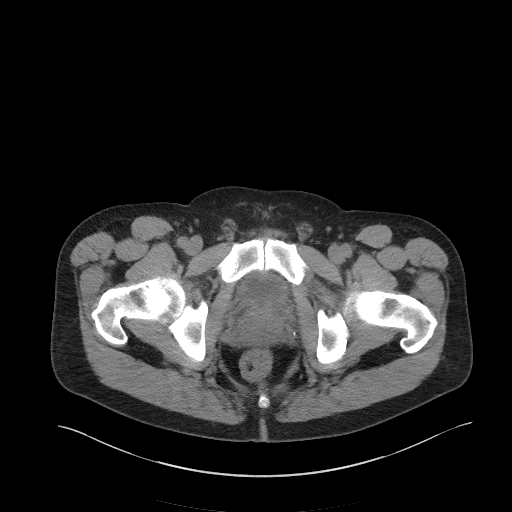
[im 29/112  soft-tissue]
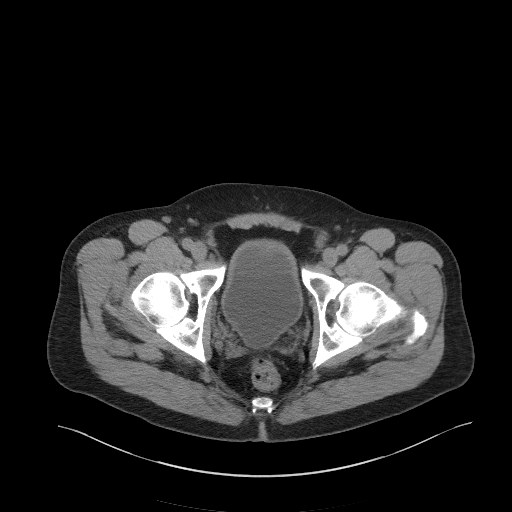
[im 39/112  soft-tissue]
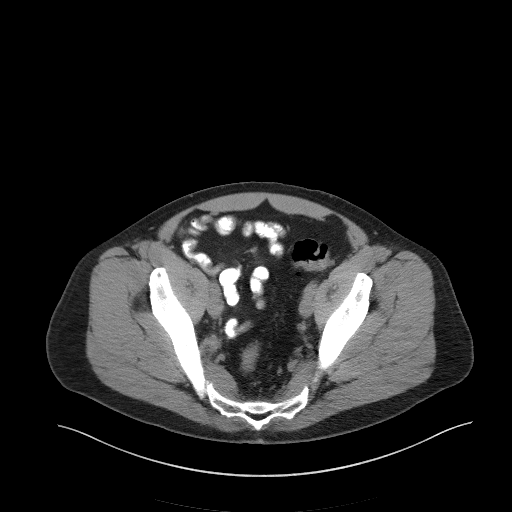
[im 49/112  soft-tissue]
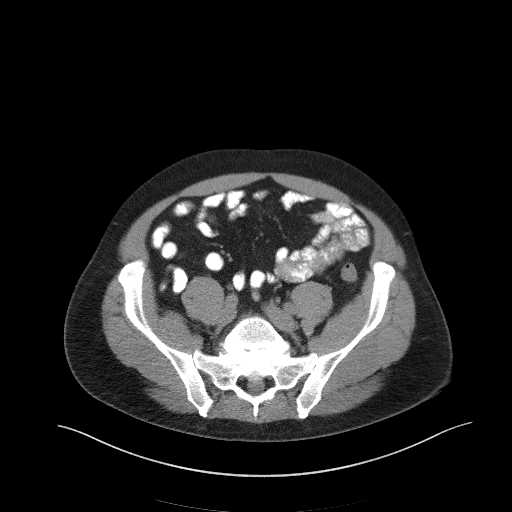
[im 58/112  soft-tissue]
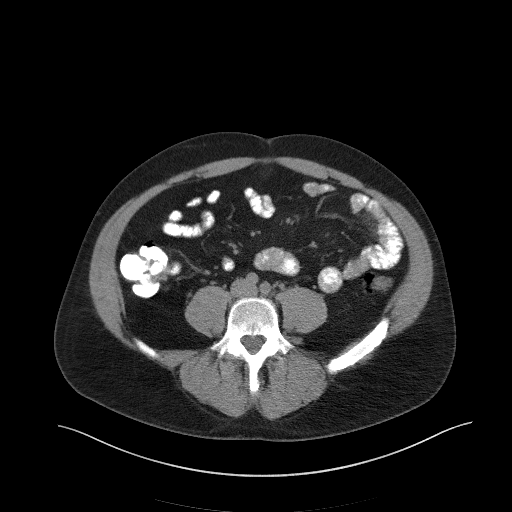
[im 63/112  soft-tissue]
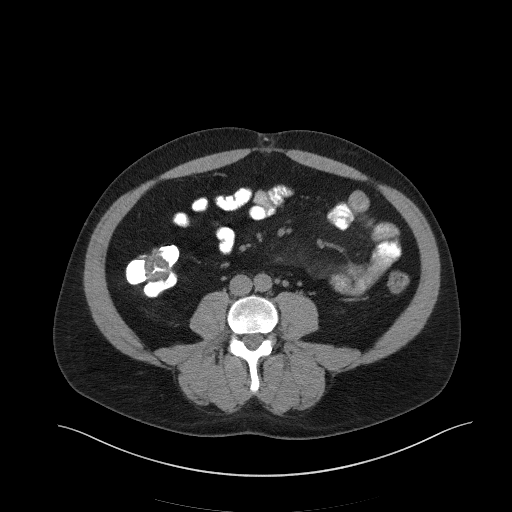
[im 73/112  soft-tissue]
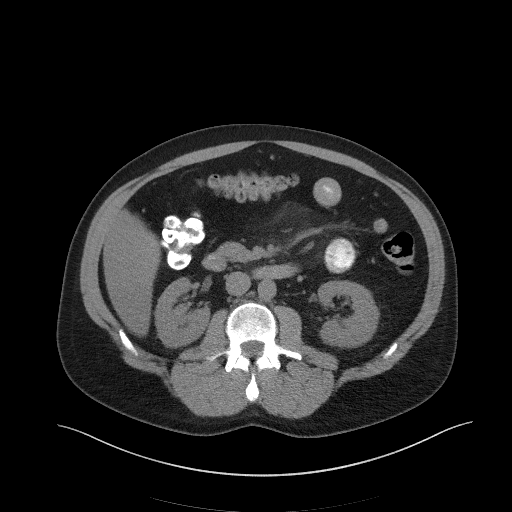
[im 73/112  bone]
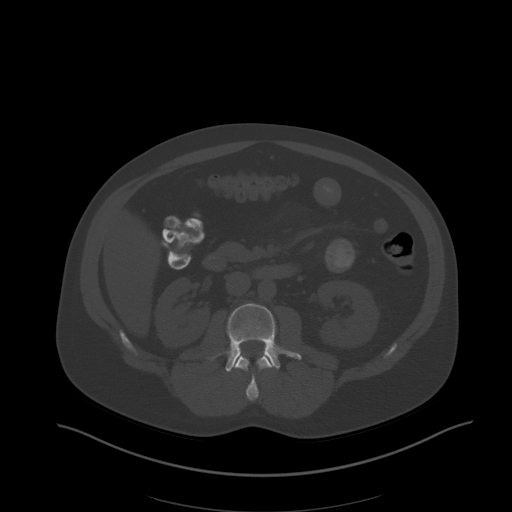
[im 83/112  soft-tissue]
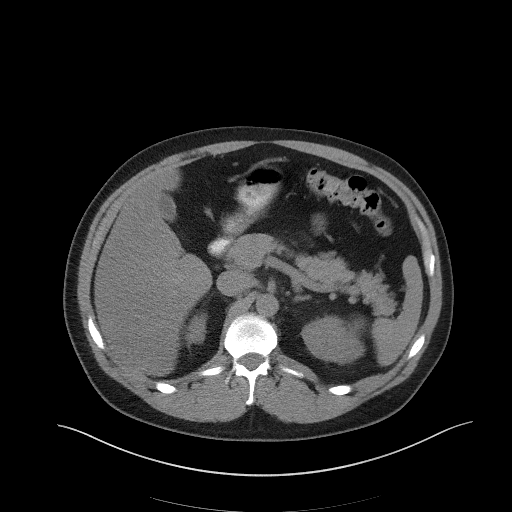
[im 87/112  soft-tissue]
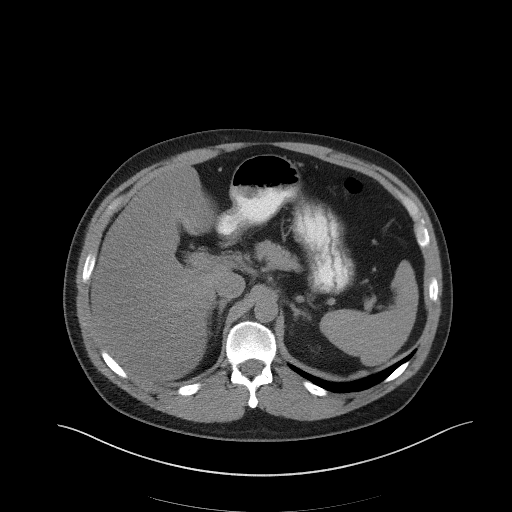
[im 92/112  lung]
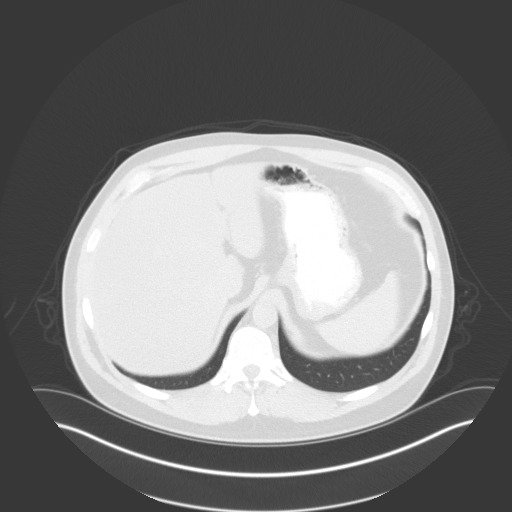
[im 97/112  soft-tissue]
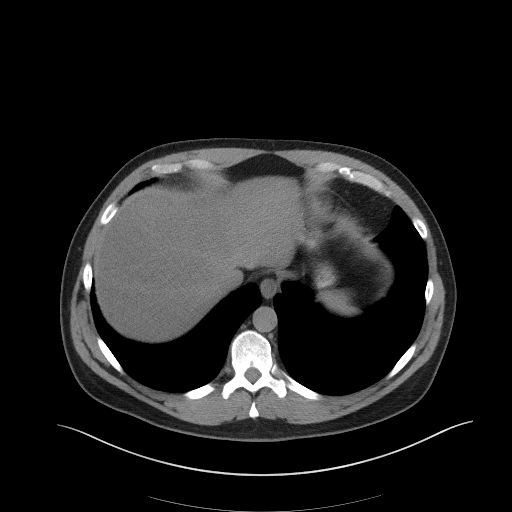
[im 97/112  lung]
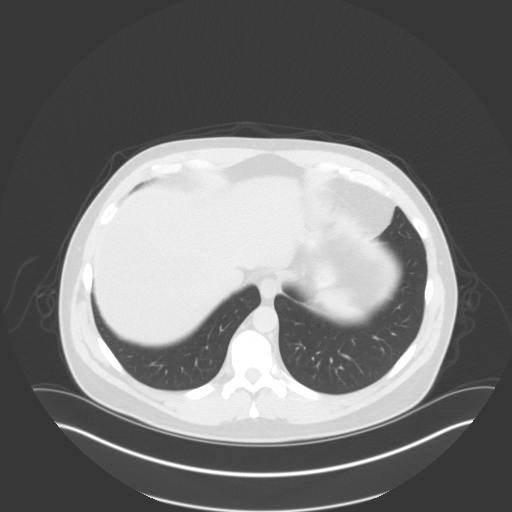
[im 102/112  lung]
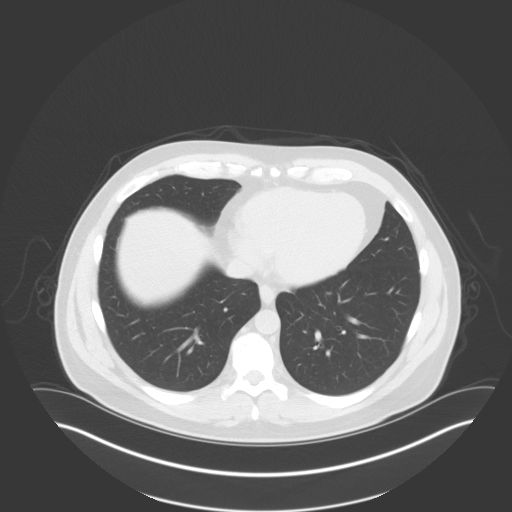
[im 107/112  soft-tissue]
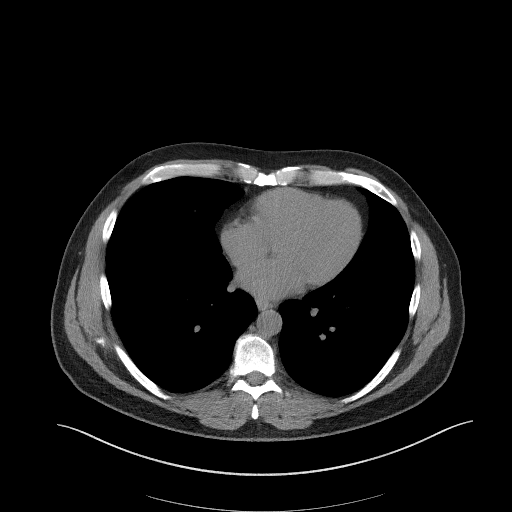
[im 107/112  lung]
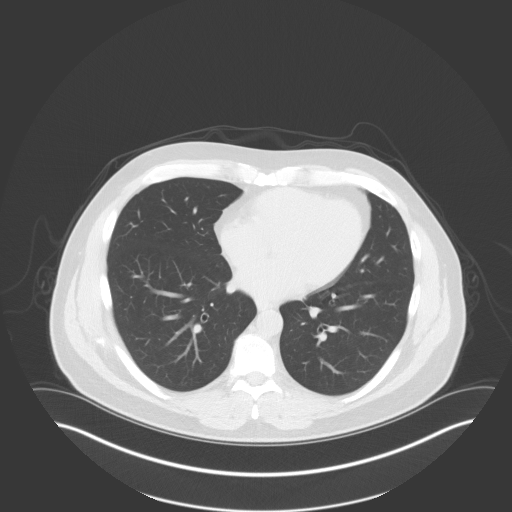

[15 of 32 positions shown; findings below may reference images not displayed]

FINDINGS: Lower chest: No acute abnormality.

Hepatobiliary: No gallstones or biliary dilatation is noted. Hepatic
steatosis.

Pancreas: Unremarkable. No pancreatic ductal dilatation or
surrounding inflammatory changes.

Spleen: Normal in size without focal abnormality.

Adrenals/Urinary Tract: Adrenal glands are unremarkable. Kidneys are
normal, without renal calculi, focal lesion, or hydronephrosis.
Bladder is unremarkable.

Stomach/Bowel: Stomach is within normal limits. Appendix appears
normal. No evidence of bowel wall thickening, distention, or
inflammatory changes.

Vascular/Lymphatic: No significant vascular findings are present. No
enlarged abdominal or pelvic lymph nodes.

Reproductive: Mild prostatic enlargement is noted.

Other: No abdominal wall hernia or abnormality. No abdominopelvic
ascites.

Musculoskeletal: Mild grade 1 anterolisthesis of L5-S1 is noted
secondary to bilateral L5 spondylolysis. No acute abnormality is
noted.
IMPRESSION: Hepatic steatosis.

Mild prostatic enlargement.

Mild grade 1 anterolisthesis of L5-S1 secondary to bilateral L5
spondylolysis.

No acute abnormality seen in the abdomen or pelvis.

## 2024-06-06 ENCOUNTER — Other Ambulatory Visit: Payer: Self-pay | Admitting: Orthopedic Surgery

## 2024-06-06 DIAGNOSIS — G8929 Other chronic pain: Secondary | ICD-10-CM

## 2024-06-06 DIAGNOSIS — M25861 Other specified joint disorders, right knee: Secondary | ICD-10-CM

## 2024-06-07 ENCOUNTER — Encounter: Payer: Self-pay | Admitting: Orthopedic Surgery

## 2024-06-19 ENCOUNTER — Ambulatory Visit
Admission: RE | Admit: 2024-06-19 | Discharge: 2024-06-19 | Disposition: A | Discharge status: Home / Self Care | Source: Ambulatory Visit | Attending: Orthopedic Surgery | Admitting: Orthopedic Surgery

## 2024-06-19 DIAGNOSIS — G8929 Other chronic pain: Secondary | ICD-10-CM

## 2024-06-19 DIAGNOSIS — M25861 Other specified joint disorders, right knee: Secondary | ICD-10-CM

## 2024-06-19 MED ORDER — GADOPICLENOL 0.5 MMOL/ML IV SOLN
10.0000 mL | Freq: Once | INTRAVENOUS | Status: AC | PRN
  Administered 2024-06-19: 10 mL via INTRAVENOUS
